# Patient Record
Sex: Female | Born: 2000 | Hispanic: Yes | Smoking: Never smoker
Health system: Southern US, Community
[De-identification: ages and names within clinical notes are randomized; demographics above are authoritative.]

## PROBLEM LIST (undated history)

## (undated) DIAGNOSIS — Z789 Other specified health status: Secondary | ICD-10-CM

## (undated) HISTORY — DX: Other specified health status: Z78.9

## (undated) HISTORY — PX: OVARIAN CYST REMOVAL: SHX89

---
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
## 2018-04-24 ENCOUNTER — Encounter: Payer: Self-pay | Admitting: Student

## 2018-04-24 ENCOUNTER — Ambulatory Visit (INDEPENDENT_AMBULATORY_CARE_PROVIDER_SITE_OTHER): Payer: Medicaid Other

## 2018-04-24 DIAGNOSIS — Z3201 Encounter for pregnancy test, result positive: Secondary | ICD-10-CM | POA: Diagnosis not present

## 2018-04-24 LAB — POCT PREGNANCY, URINE: Preg Test, Ur: POSITIVE — AB

## 2018-04-24 NOTE — Progress Notes (Signed)
Pt here today for pregancy test.  Resulted +.  Pt reports LMP 03/24/18.  EDD 12/29/2018  4w 3d.   Pt denies any bleeding or pain.  Medications reconciled.  List of medications safe to take in pregnancy given to pt.  Front office to provide a proof of pregnancy letter to start prenatal care.

## 2018-04-26 DIAGNOSIS — Z23 Encounter for immunization: Secondary | ICD-10-CM | POA: Diagnosis not present

## 2018-05-03 NOTE — Progress Notes (Signed)
I have reviewed the chart and agree with nursing staff's documentation of this patient's encounter.  [place] Bing, MD 05/03/2018 11:49 AM

## 2018-06-03 ENCOUNTER — Ambulatory Visit: Payer: Medicaid Other | Admitting: *Deleted

## 2018-06-03 ENCOUNTER — Other Ambulatory Visit (HOSPITAL_COMMUNITY)
Admission: RE | Admit: 2018-06-03 | Discharge: 2018-06-03 | Disposition: A | Discharge status: Home / Self Care | Payer: Medicaid Other | Source: Ambulatory Visit | Attending: Obstetrics and Gynecology | Admitting: Obstetrics and Gynecology

## 2018-06-03 ENCOUNTER — Other Ambulatory Visit: Payer: Self-pay

## 2018-06-03 DIAGNOSIS — Z3A10 10 weeks gestation of pregnancy: Secondary | ICD-10-CM | POA: Diagnosis not present

## 2018-06-03 DIAGNOSIS — Z3401 Encounter for supervision of normal first pregnancy, first trimester: Secondary | ICD-10-CM | POA: Diagnosis not present

## 2018-06-03 DIAGNOSIS — Z3400 Encounter for supervision of normal first pregnancy, unspecified trimester: Secondary | ICD-10-CM | POA: Insufficient documentation

## 2018-06-03 LAB — POCT URINALYSIS DIPSTICK OB
BILIRUBIN UA: NEGATIVE
Blood, UA: NEGATIVE
GLUCOSE, UA: NEGATIVE
Ketones, UA: NEGATIVE
Nitrite, UA: NEGATIVE
SPEC GRAV UA: 1.025 (ref 1.010–1.025)
Urobilinogen, UA: 0.2 E.U./dL
pH, UA: 6.5 (ref 5.0–8.0)

## 2018-06-03 NOTE — Patient Instructions (Addendum)
You will have an early ultrasound to confirm dates. Please keep upcoming appointment with provider.  Prenatal Care WHAT IS PRENATAL CARE? Prenatal care is the process of caring for a pregnant woman before she gives birth. Prenatal care makes sure that she and her baby remain as healthy as possible throughout pregnancy. Prenatal care may be provided by a midwife, family practice health care provider, or a childbirth and pregnancy specialist (obstetrician). Prenatal care may include physical examinations, testing, treatments, and education on nutrition, lifestyle, and social support services. WHY IS PRENATAL CARE SO IMPORTANT? Early and consistent prenatal care increases the chance that you and your baby will remain healthy throughout your pregnancy. This type of care also decreases a baby's risk of being born too early (prematurely), or being born smaller than expected (small for gestational age). Any underlying medical conditions you may have that could pose a risk during your pregnancy are discussed during prenatal care visits. You will also be monitored regularly for any new conditions that may arise during your pregnancy so they can be treated quickly and effectively. WHAT HAPPENS DURING PRENATAL CARE VISITS? Prenatal care visits may include the following: Discussion Tell your health care provider about any new signs or symptoms you have experienced since your last visit. These might include:  Nausea or vomiting.  Increased or decreased level of energy.  Difficulty sleeping.  Back or leg pain.  Weight changes.  Frequent urination.  Shortness of breath with physical activity.  Changes in your skin, such as the development of a rash or itchiness.  Vaginal discharge or bleeding.  Feelings of excitement or nervousness.  Changes in your baby's movements.  You may want to write down any questions or topics you want to discuss with your health care provider and bring them with you to  your appointment. Examination During your first prenatal care visit, you will likely have a complete physical exam. Your health care provider will often examine your vagina, cervix, and the position of your uterus, as well as check your heart, lungs, and other body systems. As your pregnancy progresses, your health care provider will measure the size of your uterus and your baby's position inside your uterus. He or she may also examine you for early signs of labor. Your prenatal visits may also include checking your blood pressure and, after about 10-12 weeks of pregnancy, listening to your baby's heartbeat. Testing Regular testing often includes:  Urinalysis. This checks your urine for glucose, protein, or signs of infection.  Blood count. This checks the levels of white and red blood cells in your body.  Tests for sexually transmitted infections (STIs). Testing for STIs at the beginning of pregnancy is routinely done and is required in many states.  Antibody testing. You will be checked to see if you are immune to certain illnesses, such as rubella, that can affect a developing fetus.  Glucose screen. Around 24-28 weeks of pregnancy, your blood glucose level will be checked for signs of gestational diabetes. Follow-up tests may be recommended.  Group B strep. This is a bacteria that is commonly found inside a woman's vagina. This test will inform your health care provider if you need an antibiotic to reduce the amount of this bacteria in your body prior to labor and childbirth.  Ultrasound. Many pregnant women undergo an ultrasound screening around 18-20 weeks of pregnancy to evaluate the health of the fetus and check for any developmental abnormalities.  HIV (human immunodeficiency virus) testing. Early in your pregnancy,  you will be screened for HIV. If you are at high risk for HIV, this test may be repeated during your third trimester of pregnancy.  You may be offered other testing based  on your age, personal or family medical history, or other factors. HOW OFTEN SHOULD I PLAN TO SEE MY HEALTH CARE PROVIDER FOR PRENATAL CARE? Your prenatal care check-up schedule depends on any medical conditions you have before, or develop during, your pregnancy. If you do not have any underlying medical conditions, you will likely be seen for checkups:  Monthly, during the first 6 months of pregnancy.  Twice a month during months 7 and 8 of pregnancy.  Weekly starting in the 9th month of pregnancy and until delivery.  If you develop signs of early labor or other concerning signs or symptoms, you may need to see your health care provider more often. Ask your health care provider what prenatal care schedule is best for you. WHAT CAN I DO TO KEEP MYSELF AND MY BABY AS HEALTHY AS POSSIBLE DURING MY PREGNANCY?  Take a prenatal vitamin containing 400 micrograms (0.4 mg) of folic acid every day. Your health care provider may also ask you to take additional vitamins such as iodine, vitamin D, iron, copper, and zinc.  Take 1500-2000 mg of calcium daily starting at your 20th week of pregnancy until you deliver your baby.  Make sure you are up to date on your vaccinations. Unless directed otherwise by your health care provider: ? You should receive a tetanus, diphtheria, and pertussis (Tdap) vaccination between the 27th and 36th week of your pregnancy, regardless of when your last Tdap immunization occurred. This helps protect your baby from whooping cough (pertussis) after he or she is born. ? You should receive an annual inactivated influenza vaccine (IIV) to help protect you and your baby from influenza. This can be done at any point during your pregnancy.  Eat a well-rounded diet that includes: ? Fresh fruits and vegetables. ? Lean proteins. ? Calcium-rich foods such as milk, yogurt, hard cheeses, and dark, leafy greens. ? Whole grain breads.  Do noteat seafood high in mercury,  including: ? Swordfish. ? Tilefish. ? Shark. ? King mackerel. ? More than 6 oz tuna per week.  Do not eat: ? Raw or undercooked meats or eggs. ? Unpasteurized foods, such as soft cheeses (brie, blue, or feta), juices, and milks. ? Lunch meats. ? Hot dogs that have not been heated until they are steaming.  Drink enough water to keep your urine clear or pale yellow. For many women, this may be 10 or more 8 oz glasses of water each day. Keeping yourself hydrated helps deliver nutrients to your baby and may prevent the start of pre-term uterine contractions.  Do not use any tobacco products including cigarettes, chewing tobacco, or electronic cigarettes. If you need help quitting, ask your health care provider.  Do not drink beverages containing alcohol. No safe level of alcohol consumption during pregnancy has been determined.  Do not use any illegal drugs. These can harm your developing baby or cause a miscarriage.  Ask your health care provider or pharmacist before taking any prescription or over-the-counter medicines, herbs, or supplements.  Limit your caffeine intake to no more than 200 mg per day.  Exercise. Unless told otherwise by your health care provider, try to get 30 minutes of moderate exercise most days of the week. Do not  do high-impact activities, contact sports, or activities with a high risk of falling, such  as horseback riding or downhill skiing.  Get plenty of rest.  Avoid anything that raises your body temperature, such as hot tubs and saunas.  If you own a cat, do not empty its litter box. Bacteria contained in cat feces can cause an infection called toxoplasmosis. This can result in serious harm to the fetus.  Stay away from chemicals such as insecticides, lead, mercury, and cleaning or paint products that contain solvents.  Do not have any X-rays taken unless medically necessary.  Take a childbirth and breastfeeding preparation class. Ask your health care  provider if you need a referral or recommendation.  This information is not intended to replace advice given to you by your health care provider. Make sure you discuss any questions you have with your health care provider. Document Released: 07/27/2003 Document Revised: 12/27/2015 Document Reviewed: 10/08/2013 Elsevier Interactive Patient Education  2017 [employer].  Preventing Preterm Birth Preterm birth is when your baby is delivered between 20 weeks and 37 weeks of pregnancy. A full-term pregnancy lasts for at least 37 weeks. Preterm birth can be dangerous for your baby because the last few weeks of pregnancy are an important time for your baby's brain and lungs to grow. Many things can cause a baby to be born early. Sometimes the cause is not known. There are certain factors that make you more likely to experience preterm birth, such as:  Having a previous baby born preterm.  Being pregnant with twins or other multiples.  Having had fertility treatment.  Being overweight or underweight at the start of your pregnancy.  Having any of the following during pregnancy: ? An infection, including a urinary tract infection (UTI) or an STI (sexually transmitted infection). ? High blood pressure. ? Diabetes. ? Vaginal bleeding.  Being age 80 or older.  Being age 76 or younger.  Getting pregnant within 6 months of a previous pregnancy.  Suffering extreme stress or physical or emotional abuse during pregnancy.  Standing for long periods of time during pregnancy, such as working at a job that requires standing.  What are the risks? The most serious risk of preterm birth is that the baby may not survive. This is more likely to happen if a baby is born before 34 weeks. Other risks and complications of preterm birth may include your baby having:  Breathing problems.  Brain damage that affects movement and coordination (cerebral palsy).  Feeding difficulties.  Vision or hearing  problems.  Infections or inflammation of the digestive tract (colitis).  Developmental delays.  Learning disabilities.  Higher risk for diabetes, heart disease, and high blood pressure later in life.  What can I do to lower my risk? Medical care  The most important thing you can do to lower your risk for preterm birth is to get routine medical care during pregnancy (prenatal care). If you have a high risk of preterm birth, you may be referred to a health care provider who specializes in managing high-risk pregnancies (perinatologist). You may be given medicine to help prevent preterm birth. Lifestyle changes Certain lifestyle changes can also lower your risk of preterm birth:  Wait at least 6 months after a pregnancy to become pregnant again.  Try to plan pregnancy for when you are between 14 and 74 years old.  Get to a healthy weight before getting pregnant. If you are overweight, work with your health care provider to safely lose weight.  Do not use any products that contain nicotine or tobacco, such as cigarettes and  e-cigarettes. If you need help quitting, ask your health care provider.  Do not drink alcohol.  Do not use drugs.  Where to find support: For more support, consider:  Talking with your health care provider.  Talking with a therapist or substance abuse counselor, if you need help quitting.  Working with a diet and nutrition specialist (dietitian) or a Systems analyst to maintain a healthy weight.  Joining a support group.  Where to find more information: Learn more about preventing preterm birth from:  Centers for Disease Control and Prevention: http://curry.org/  March of Dimes: marchofdimes.org/complications/premature-babies.aspx  American Pregnancy Association: americanpregnancy.org/labor-and-birth/premature-labor  Contact a health care provider if:  You have any of the following signs of preterm  labor before 37 weeks: ? A change or increase in vaginal discharge. ? Fluid leaking from your vagina. ? Pressure or cramps in your lower abdomen. ? A backache that does not go away or gets worse. ? Regular tightening (contractions) in your lower abdomen. Summary  Preterm birth means having your baby during weeks 20-37 of pregnancy.  Preterm birth may put your baby at risk for physical and mental problems.  Getting good prenatal care can help prevent preterm birth.  You can lower your risk of preterm birth by making certain lifestyle changes, such as not smoking and not using alcohol. This information is not intended to replace advice given to you by your health care provider. Make sure you discuss any questions you have with your health care provider. Document Released: 09/07/2015 Document Revised: 04/01/2016 Document Reviewed: 04/01/2016 Elsevier Interactive Patient Education  [employer].

## 2018-06-03 NOTE — Progress Notes (Signed)
   PRENATAL INTAKE SUMMARY  Ms. Froberg presents today New OB Nurse Interview.  OB History    Gravida  1   Para      Term      Preterm      AB      Living        SAB      TAB      Ectopic      Multiple      Live Births             I have reviewed the patient's medical, obstetrical, social, and family histories, medications, and available lab results.  SUBJECTIVE She has complains of thin clear vaginal discharge.   OBJECTIVE Initial nurse interview for history and lab work (New OB). EDD: 12/29/2018 GA:[redacted]w[redacted]d by LMP 03/24/18 G1P0  GENERAL APPEARANCE: alert, well appearing.   ASSESSMENT Normal pregnancy.  PLAN Prenatal care-[facility] Pnl/HIV  OB Urine Culture/dip GC/CT HgbEval SMA/CF/Panorama at next visit with CNM. A1C/Glucose-not indicated. U/S <14 wk w/transvaginal ordered to confirm date. Continue PNV. Advised on preterm labor prevention/symptoms. Pt to sign up for MyChart.  Clovis Pu, RN

## 2018-06-04 ENCOUNTER — Other Ambulatory Visit: Payer: Self-pay | Admitting: Obstetrics and Gynecology

## 2018-06-04 DIAGNOSIS — Z3687 Encounter for antenatal screening for uncertain dates: Secondary | ICD-10-CM

## 2018-06-04 LAB — OBSTETRIC PANEL, INCLUDING HIV
ANTIBODY SCREEN: NEGATIVE
BASOS: 1 %
Basophils Absolute: 0.1 10*3/uL (ref 0.0–0.2)
EOS (ABSOLUTE): 0.4 10*3/uL (ref 0.0–0.4)
EOS: 4 %
HEMOGLOBIN: 11.5 g/dL (ref 11.1–15.9)
HEP B S AG: NEGATIVE
HIV SCREEN 4TH GENERATION: NONREACTIVE
Hematocrit: 34.7 % (ref 34.0–46.6)
Immature Grans (Abs): 0 10*3/uL (ref 0.0–0.1)
Immature Granulocytes: 0 %
Lymphocytes Absolute: 2.1 10*3/uL (ref 0.7–3.1)
Lymphs: 23 %
MCH: 24.8 pg — AB (ref 26.6–33.0)
MCHC: 33.1 g/dL (ref 31.5–35.7)
MCV: 75 fL — AB (ref 79–97)
MONOS ABS: 0.6 10*3/uL (ref 0.1–0.9)
Monocytes: 6 %
NEUTROS ABS: 6 10*3/uL (ref 1.4–7.0)
NEUTROS PCT: 66 %
Platelets: 252 10*3/uL (ref 150–450)
RBC: 4.64 x10E6/uL (ref 3.77–5.28)
RDW: 14.2 % (ref 12.3–15.4)
RH TYPE: POSITIVE
RPR: NONREACTIVE
Rubella Antibodies, IGG: 1.52 index (ref >0.99)
WBC: 9.2 10*3/uL (ref 3.4–10.8)

## 2018-06-04 LAB — CERVICOVAGINAL ANCILLARY ONLY
Bacterial vaginitis: NEGATIVE
CANDIDA VAGINITIS: NEGATIVE
Chlamydia: NEGATIVE
Neisseria Gonorrhea: NEGATIVE
TRICH (WINDOWPATH): NEGATIVE

## 2018-06-04 LAB — SICKLE CELL SCREEN: Sickle Cell Screen: NEGATIVE

## 2018-06-04 NOTE — Progress Notes (Signed)
Corrected U/S ordered per U/S dept

## 2018-06-04 NOTE — Addendum Note (Signed)
Addended by: Kenard Gower on: 06/04/2018 04:44 PM   Modules accepted: Orders

## 2018-06-05 ENCOUNTER — Telehealth: Payer: Self-pay | Admitting: General Practice

## 2018-06-06 ENCOUNTER — Ambulatory Visit (HOSPITAL_COMMUNITY): Payer: Medicaid Other

## 2018-06-06 ENCOUNTER — Encounter (HOSPITAL_COMMUNITY): Payer: Self-pay

## 2018-06-07 LAB — CULTURE, OB URINE

## 2018-06-07 LAB — URINE CULTURE, OB REFLEX

## 2018-06-12 ENCOUNTER — Ambulatory Visit (INDEPENDENT_AMBULATORY_CARE_PROVIDER_SITE_OTHER): Payer: Self-pay | Admitting: General Practice

## 2018-06-12 ENCOUNTER — Other Ambulatory Visit: Payer: Self-pay | Admitting: Obstetrics and Gynecology

## 2018-06-12 ENCOUNTER — Ambulatory Visit (HOSPITAL_COMMUNITY)
Admission: RE | Admit: 2018-06-12 | Discharge: 2018-06-12 | Disposition: A | Discharge status: Home / Self Care | Payer: Medicaid Other | Source: Ambulatory Visit | Attending: Obstetrics and Gynecology | Admitting: Obstetrics and Gynecology

## 2018-06-12 DIAGNOSIS — Z3687 Encounter for antenatal screening for uncertain dates: Secondary | ICD-10-CM | POA: Diagnosis not present

## 2018-06-12 DIAGNOSIS — Z712 Person consulting for explanation of examination or test findings: Secondary | ICD-10-CM

## 2018-06-12 DIAGNOSIS — Z3A12 12 weeks gestation of pregnancy: Secondary | ICD-10-CM | POA: Insufficient documentation

## 2018-06-12 DIAGNOSIS — O26841 Uterine size-date discrepancy, first trimester: Secondary | ICD-10-CM | POA: Diagnosis not present

## 2018-06-12 NOTE — Progress Notes (Signed)
Agree with A & P. 

## 2018-06-12 NOTE — Progress Notes (Signed)
Patient presents to office today for dating ultrasound results. Reviewed with Dr Alysia Penna who finds [redacted] week gestation EDD 12/25/18, patient can begin prenatal care.  Informed patient of results. Patient has new OB appt 11/8 at [facility] office. Patient had no questions.

## 2018-06-14 ENCOUNTER — Ambulatory Visit (INDEPENDENT_AMBULATORY_CARE_PROVIDER_SITE_OTHER): Payer: Self-pay | Admitting: Obstetrics and Gynecology

## 2018-06-14 ENCOUNTER — Other Ambulatory Visit: Payer: Self-pay

## 2018-06-14 DIAGNOSIS — Z3400 Encounter for supervision of normal first pregnancy, unspecified trimester: Secondary | ICD-10-CM

## 2018-06-14 DIAGNOSIS — Z3401 Encounter for supervision of normal first pregnancy, first trimester: Secondary | ICD-10-CM

## 2018-06-14 NOTE — Progress Notes (Signed)
  Subjective:    Jennifer Griffith is being seen today for her first obstetrical visit.  This is a planned pregnancy. She is at [redacted]w[redacted]d gestation. Her obstetrical history is significant for N/A. Relationship with FOB (Homero): significant other, living together. Patient does intend to breast feed. Pregnancy history fully reviewed.  Patient reports no complaints.  Review of Systems:   Review of Systems  Constitutional: Negative.   HENT: Negative.   Eyes: Negative.   Respiratory: Negative.   Cardiovascular: Negative.   Gastrointestinal: Negative.   Endocrine: Negative.   Genitourinary: Negative.   Musculoskeletal: Negative.   Skin: Negative.   Allergic/Immunologic: Negative.   Neurological: Negative.   Hematological: Negative.   Psychiatric/Behavioral: Negative.     Objective:     BP 125/67   Pulse 74   Wt 183 lb 3.2 oz (83.1 kg)   LMP 03/24/2018 (LMP Unknown)   BMI 32.45 kg/m  Physical Exam  Nursing note and vitals reviewed. Constitutional: She is oriented to person, place, and time. She appears well-developed and well-nourished.  HENT:  Head: Normocephalic and atraumatic.  Right Ear: External ear normal.  Left Ear: External ear normal.  Nose: Nose normal.  Mouth/Throat: Oropharynx is clear and moist.  Eyes: Pupils are equal, round, and reactive to light. Conjunctivae and EOM are normal.  Neck: Normal range of motion. Neck supple.  Cardiovascular: Normal rate, regular rhythm, normal heart sounds and intact distal pulses.  Respiratory: Effort normal and breath sounds normal.  GI: Soft. Bowel sounds are normal.  Genitourinary: Vagina normal and uterus normal.  Genitourinary Comments: Uterus: enlarged, S=D, SE: cervix is smooth, pink, no lesions, small amt of thick, white vaginal d/c; Cervix: closed/long/firm, no CMT or friability, no adnexal tenderness   Musculoskeletal: Normal range of motion.  Neurological: She is alert and oriented to person, place, and time. She has  normal reflexes.  Skin: Skin is warm and dry.  Psychiatric: She has a normal mood and affect. Her behavior is normal. Judgment and thought content normal.    Maternal Exam:  Abdomen: Patient reports no abdominal tenderness. Introitus: Normal vulva. Normal vagina.  Ferning test: not done.  Nitrazine test: not done. Amniotic fluid character: not assessed.  Pelvis: adequate for delivery.   Cervix: Cervix evaluated by sterile speculum exam and digital exam.     Fetal Exam Fetal Monitor Review: Mode: ultrasound.   Baseline rate: 150 bpm.      Patient informed that the ultrasound is considered a limited OB ultrasound and is not intended to be a complete ultrasound exam.  Patient also informed that the ultrasound is not being completed with the intent of assessing for fetal or placental anomalies or any pelvic abnormalities.  Explained that the purpose of today's ultrasound is to assess for  viability d/t difficulty hearing FHTs with hand-held doppler.  Patient acknowledges the purpose of the exam and the limitations of the study.  Assessment:    Pregnancy: G1P0 Patient Active Problem List   Diagnosis Date Noted  . Supervision of normal first pregnancy, antepartum 06/03/2018       Plan:     Initial labs drawn. Prenatal vitamins. Problem list reviewed and updated. AFP3 discussed: undecided. Role of ultrasound in pregnancy discussed; fetal survey: undecided. Amniocentesis discussed: not indicated. Follow up in 4 weeks. 50% of 40 min visit spent on counseling and coordination of care.     Raelyn Mora, MSN, CNM 06/14/2018

## 2018-06-14 NOTE — Patient Instructions (Signed)
First Trimester of Pregnancy The first trimester of pregnancy is from week 1 until the end of week 13 (months 1 through 3). During this time, your baby will begin to develop inside you. At 6-8 weeks, the eyes and face are formed, and the heartbeat can be seen on ultrasound. At the end of 12 weeks, all the baby's organs are formed. Prenatal care is all the medical care you receive before the birth of your baby. Make sure you get good prenatal care and follow all of your doctor's instructions. Follow these instructions at home: Medicines  Take over-the-counter and prescription medicines only as told by your doctor. Some medicines are safe and some medicines are not safe during pregnancy.  Take a prenatal vitamin that contains at least 600 micrograms (mcg) of folic acid.  If you have trouble pooping (constipation), take medicine that will make your stool soft (stool softener) if your doctor approves. Eating and drinking  Eat regular, healthy meals.  Your doctor will tell you the amount of weight gain that is right for you.  Avoid raw meat and uncooked cheese.  If you feel sick to your stomach (nauseous) or throw up (vomit): ? Eat 4 or 5 small meals a day instead of 3 large meals. ? Try eating a few soda crackers. ? Drink liquids between meals instead of during meals.  To prevent constipation: ? Eat foods that are high in fiber, like fresh fruits and vegetables, whole grains, and beans. ? Drink enough fluids to keep your pee (urine) clear or pale yellow. Activity  Exercise only as told by your doctor. Stop exercising if you have cramps or pain in your lower belly (abdomen) or low back.  Do not exercise if it is too hot, too humid, or if you are in a place of great height (high altitude).  Try to avoid standing for long periods of time. Move your legs often if you must stand in one place for a long time.  Avoid heavy lifting.  Wear low-heeled shoes. Sit and stand up straight.  You  can have sex unless your doctor tells you not to. Relieving pain and discomfort  Wear a good support bra if your breasts are sore.  Take warm water baths (sitz baths) to soothe pain or discomfort caused by hemorrhoids. Use hemorrhoid cream if your doctor says it is okay.  Rest with your legs raised if you have leg cramps or low back pain.  If you have puffy, bulging veins (varicose veins) in your legs: ? Wear support hose or compression stockings as told by your doctor. ? Raise (elevate) your feet for 15 minutes, 3-4 times a day. ? Limit salt in your food. Prenatal care  Schedule your prenatal visits by the twelfth week of pregnancy.  Write down your questions. Take them to your prenatal visits.  Keep all your prenatal visits as told by your doctor. This is important. Safety  Wear your seat belt at all times when driving.  Make a list of emergency phone numbers. The list should include numbers for family, friends, the hospital, and police and fire departments. General instructions  Ask your doctor for a referral to a local prenatal class. Begin classes no later than at the start of month 6 of your pregnancy.  Ask for help if you need counseling or if you need help with nutrition. Your doctor can give you advice or tell you where to go for help.  Do not use hot tubs, steam rooms, or   saunas.  Do not douche or use tampons or scented sanitary pads.  Do not cross your legs for long periods of time.  Avoid all herbs and alcohol. Avoid drugs that are not approved by your doctor.  Do not use any tobacco products, including cigarettes, chewing tobacco, and electronic cigarettes. If you need help quitting, ask your doctor. You may get counseling or other support to help you quit.  Avoid cat litter boxes and soil used by cats. These carry germs that can cause birth defects in the baby and can cause a loss of your baby (miscarriage) or stillbirth.  Visit your dentist. At home, brush  your teeth with a soft toothbrush. Be gentle when you floss. Contact a doctor if:  You are dizzy.  You have mild cramps or pressure in your lower belly.  You have a nagging pain in your belly area.  You continue to feel sick to your stomach, you throw up, or you have watery poop (diarrhea).  You have a bad smelling fluid coming from your vagina.  You have pain when you pee (urinate).  You have increased puffiness (swelling) in your face, hands, legs, or ankles. Get help right away if:  You have a fever.  You are leaking fluid from your vagina.  You have spotting or bleeding from your vagina.  You have very bad belly cramping or pain.  You gain or lose weight rapidly.  You throw up blood. It may look like coffee grounds.  You are around people who have German measles, fifth disease, or chickenpox.  You have a very bad headache.  You have shortness of breath.  You have any kind of trauma, such as from a fall or a car accident. Summary  The first trimester of pregnancy is from week 1 until the end of week 13 (months 1 through 3).  To take care of yourself and your unborn baby, you will need to eat healthy meals, take medicines only if your doctor tells you to do so, and do activities that are safe for you and your baby.  Keep all follow-up visits as told by your doctor. This is important as your doctor will have to ensure that your baby is healthy and growing well. This information is not intended to replace advice given to you by your health care provider. Make sure you discuss any questions you have with your health care provider. Document Released: 01/10/2008 Document Revised: 08/01/2016 Document Reviewed: 08/01/2016 Elsevier Interactive Patient Education  2017 [employer]  

## 2018-06-20 ENCOUNTER — Encounter: Payer: Medicaid Other | Admitting: Obstetrics and Gynecology

## 2018-07-10 ENCOUNTER — Ambulatory Visit (INDEPENDENT_AMBULATORY_CARE_PROVIDER_SITE_OTHER): Payer: Self-pay | Admitting: Obstetrics and Gynecology

## 2018-07-10 ENCOUNTER — Other Ambulatory Visit: Payer: Self-pay

## 2018-07-10 VITALS — BP 113/68 | HR 72 | Wt 184.6 lb

## 2018-07-10 DIAGNOSIS — Z3400 Encounter for supervision of normal first pregnancy, unspecified trimester: Secondary | ICD-10-CM

## 2018-07-10 DIAGNOSIS — Z3402 Encounter for supervision of normal first pregnancy, second trimester: Secondary | ICD-10-CM

## 2018-07-10 DIAGNOSIS — Z3A15 15 weeks gestation of pregnancy: Secondary | ICD-10-CM

## 2018-07-10 NOTE — Progress Notes (Signed)
   PRENATAL VISIT NOTE  Subjective:  Jennifer Griffith is a 17 y.o. G1P0 at 1150w3d being seen today for ongoing prenatal care.  She is currently monitored for the following issues for this low-risk pregnancy and has Supervision of normal first pregnancy, antepartum on their problem list.  Patient reports no complaints.  Contractions: Not present. Vag. Bleeding: None.  Movement: Absent. Denies leaking of fluid.   The following portions of the patient's history were reviewed and updated as appropriate: allergies, current medications, past family history, past medical history, past social history, past surgical history and problem list. Problem list updated.  Objective:   Vitals:   07/10/18 0818  BP: 113/68  Pulse: 72  Weight: 83.7 kg    Fetal Status: Fetal Heart Rate (bpm): 144   Movement: Absent     General:  Alert, oriented and cooperative. Patient is in no acute distress.  Skin: Skin is warm and dry. No rash noted.   Cardiovascular: Normal heart rate noted  Respiratory: Normal respiratory effort, no problems with respiration noted  Abdomen: Soft, gravid, appropriate for gestational age.  Pain/Pressure: Absent     Pelvic: Cervical exam deferred        Extremities: Normal range of motion.  Edema: None  Mental Status: Normal mood and affect. Normal behavior. Normal judgment and thought content.   Assessment and Plan:  Pregnancy: G1P0 at 7150w3d  1. Supervision of normal first pregnancy, antepartum - Size=Dates - Will bring flu shot documentation at next appointment. - Requested pp IUD. - US MFM OB COMP + 14 WK; Future - Cystic Fibrosis Mutation 97 - SMN1 COPY NUMBER ANALYSIS (SMA Carrier Screen)  Preterm labor symptoms and general obstetric precautions including but not limited to vaginal bleeding, contractions, leaking of fluid and fetal movement were reviewed in detail with the patient. Please refer to After Visit Summary for other counseling recommendations.  Return in about 5  weeks (around 08/14/2018) for Return OB visit.  Future Appointments  Date Time Provider Department Center  08/08/2018 10:10 AM Raelyn Moraawson, [place], CNM [facility] None    Bernerd LimboJamilla R Tamberlyn Midgley, Student-MidWife

## 2018-07-15 ENCOUNTER — Encounter: Payer: Self-pay | Admitting: General Practice

## 2018-07-17 LAB — CYSTIC FIBROSIS MUTATION 97: GENE DIS ANAL CARRIER INTERP BLD/T-IMP: NOT DETECTED

## 2018-07-18 LAB — SMN1 COPY NUMBER ANALYSIS (SMA CARRIER SCREENING)

## 2018-07-29 ENCOUNTER — Encounter (HOSPITAL_COMMUNITY): Payer: Self-pay

## 2018-08-06 ENCOUNTER — Other Ambulatory Visit (HOSPITAL_COMMUNITY): Payer: Self-pay | Admitting: *Deleted

## 2018-08-06 ENCOUNTER — Ambulatory Visit (HOSPITAL_COMMUNITY)
Admission: RE | Admit: 2018-08-06 | Discharge: 2018-08-06 | Disposition: A | Discharge status: Home / Self Care | Payer: Medicaid Other | Source: Ambulatory Visit | Attending: Obstetrics and Gynecology | Admitting: Obstetrics and Gynecology

## 2018-08-06 ENCOUNTER — Other Ambulatory Visit: Payer: Self-pay | Admitting: Obstetrics and Gynecology

## 2018-08-06 DIAGNOSIS — O99212 Obesity complicating pregnancy, second trimester: Secondary | ICD-10-CM

## 2018-08-06 DIAGNOSIS — Z362 Encounter for other antenatal screening follow-up: Secondary | ICD-10-CM

## 2018-08-06 DIAGNOSIS — Z363 Encounter for antenatal screening for malformations: Secondary | ICD-10-CM

## 2018-08-06 DIAGNOSIS — Z3400 Encounter for supervision of normal first pregnancy, unspecified trimester: Secondary | ICD-10-CM | POA: Insufficient documentation

## 2018-08-06 DIAGNOSIS — Z3A19 19 weeks gestation of pregnancy: Secondary | ICD-10-CM

## 2018-08-07 NOTE — L&D Delivery Note (Addendum)
Delivery Note Jennifer Griffith is a 18 y.o. G1P0 at [redacted]w[redacted]d admitted for PROM.  Labor course: cytotec, foley bulb, pitocin ROM: 28h 84m with clear fluid  At 0832 a viable female was delivered via spontaneous vaginal delivery (Presentation: ROA). Shoulders were snug, McRobert's effectively released anterior shoulder.  Infant, very warm to touch, placed directly on mom's abdomen for bonding/skin-to-skin. Baby's temp 102. Delayed cord clamping x , then cord clamped x 2, and cut by FOB.  APGAR: see delivery note, vigorous infant; weight: pending at time of note.  40 units of pitocin diluted in 1000cc LR was infused rapidly IV per protocol. The placenta separated spontaneously and delivered via CCT and maternal pushing effort.  It was inspected and appears to be intact with a 3 VC.  Placenta/Cord with the following complications: slight foul smell, sent to path. Mom afebrile throughout labor, no fetal tachycardia noted, but was ruptured 28hr prior to delivery. Cord pH: not done  Intrapartum complications:  Suspected chorio Anesthesia:  epidural Episiotomy: none Lacerations:  2nd degree Suture Repair: 3.0 vicryl rapide Est. Blood Loss (mL): 250 Sponge and instrument count were correct x2.  Mom to postpartum.  Baby to Couplet care / Skin to Skin. Placenta to path. Plans to breastfeed Contraception: postplacental Liletta IUD placed Circ: n/a  Cheral Marker, CNM, [facility] 12/30/2018 9:21 AM   Post-Placental IUD Insertion Procedure Note  Patient identified, informed consent signed prior to delivery, signed copy in chart, time out was performed.    Vaginal, labial and perineal areas thoroughly inspected for lacerations. 2nd degree laceration identified - repaired after insertion of IUD.   Liletta  - IUD grasped between sterile gloved fingers. Fundus identified through abdominal wall using non-insertion hand. IUD inserted to fundus with bimanual technique. IUD carefully released at the fundus  and insertion hand gently removed from vagina.   Strings trimmed to the level of the introitus. Patient tolerated procedure well.  Lot # [id number] Expiration Date Nov 2023  Patient given post procedure instructions and IUD care card with expiration date.  Patient is asked to keep IUD strings tucked in her vagina until her postpartum follow up visit in 4-6 weeks. Patient advised to abstain from sexual intercourse and pulling on strings before her follow-up visit. Patient verbalized an understanding of the plan of care and agrees.    Cheral Marker, CNM, [facility] 12/30/2018 9:25 AM

## 2018-08-08 ENCOUNTER — Encounter: Payer: Medicaid Other | Admitting: Obstetrics and Gynecology

## 2018-08-14 ENCOUNTER — Ambulatory Visit (INDEPENDENT_AMBULATORY_CARE_PROVIDER_SITE_OTHER): Payer: Medicaid Other | Admitting: Obstetrics and Gynecology

## 2018-08-14 ENCOUNTER — Encounter: Payer: Self-pay | Admitting: General Practice

## 2018-08-14 ENCOUNTER — Other Ambulatory Visit: Payer: Self-pay

## 2018-08-14 VITALS — BP 118/70 | HR 75 | Wt 186.6 lb

## 2018-08-14 DIAGNOSIS — Z3482 Encounter for supervision of other normal pregnancy, second trimester: Secondary | ICD-10-CM | POA: Diagnosis not present

## 2018-08-14 DIAGNOSIS — Z3402 Encounter for supervision of normal first pregnancy, second trimester: Secondary | ICD-10-CM | POA: Diagnosis not present

## 2018-08-14 DIAGNOSIS — N949 Unspecified condition associated with female genital organs and menstrual cycle: Secondary | ICD-10-CM

## 2018-08-14 DIAGNOSIS — Z3A20 20 weeks gestation of pregnancy: Secondary | ICD-10-CM

## 2018-08-14 NOTE — Progress Notes (Signed)
   PRENATAL VISIT NOTE  Subjective:  Jennifer Griffith is a 18 y.o. G1P0 at [redacted]w[redacted]d being seen today for ongoing prenatal care.  She is currently monitored for the following issues for this low-risk pregnancy and has Supervision of normal first pregnancy, antepartum on their problem list.  Patient reports round ligament pain.  Contractions: Not present. Vag. Bleeding: None.  Movement: Present. Denies leaking of fluid.   The following portions of the patient's history were reviewed and updated as appropriate: allergies, current medications, past family history, past medical history, past social history, past surgical history and problem list. Problem list updated.  Objective:   Vitals:   08/14/18 0907  BP: 118/70  Pulse: 75  Weight: 84.6 kg    Fetal Status: Fetal Heart Rate (bpm): 150 Fundal Height: 20 cm Movement: Present     General:  Alert, oriented and cooperative. Patient is in no acute distress.  Skin: Skin is warm and dry. No rash noted.   Cardiovascular: Normal heart rate noted  Respiratory: Normal respiratory effort, no problems with respiration noted  Abdomen: Soft, gravid, appropriate for gestational age.  Pain/Pressure: Present     Pelvic: Cervical exam deferred        Extremities: Normal range of motion.  Edema: None  Mental Status: Normal mood and affect. Normal behavior. Normal judgment and thought content.   Assessment and Plan:  Pregnancy: G1P0 at [redacted]w[redacted]d  1. Encounter for supervision of normal first pregnancy in second trimester - Panorama today  2. Round ligament pain - Discussed massage and stretches to relieve pain.  Preterm labor symptoms and general obstetric precautions including but not limited to vaginal bleeding, contractions, leaking of fluid and fetal movement were reviewed in detail with the patient. Please refer to After Visit Summary for other counseling recommendations.  Return in about 4 weeks (around 09/11/2018) for ROB.  Future Appointments    Date Time Provider Department Center  09/03/2018  8:15 AM [facility] [place] 4 [facility]    Bernerd Limbo, Student-MidWife

## 2018-08-15 ENCOUNTER — Encounter: Payer: Self-pay | Admitting: General Practice

## 2018-08-26 ENCOUNTER — Encounter: Payer: Self-pay | Admitting: General Practice

## 2018-09-03 ENCOUNTER — Ambulatory Visit (HOSPITAL_COMMUNITY)
Admission: RE | Admit: 2018-09-03 | Discharge: 2018-09-03 | Disposition: A | Discharge status: Home / Self Care | Payer: Medicaid Other | Source: Ambulatory Visit | Attending: Obstetrics and Gynecology | Admitting: Obstetrics and Gynecology

## 2018-09-03 DIAGNOSIS — Z362 Encounter for other antenatal screening follow-up: Secondary | ICD-10-CM

## 2018-09-03 DIAGNOSIS — Z3687 Encounter for antenatal screening for uncertain dates: Secondary | ICD-10-CM | POA: Diagnosis not present

## 2018-09-03 DIAGNOSIS — Z363 Encounter for antenatal screening for malformations: Secondary | ICD-10-CM | POA: Diagnosis not present

## 2018-09-03 DIAGNOSIS — O99212 Obesity complicating pregnancy, second trimester: Secondary | ICD-10-CM | POA: Diagnosis not present

## 2018-09-03 DIAGNOSIS — Z3A23 23 weeks gestation of pregnancy: Secondary | ICD-10-CM

## 2018-09-06 ENCOUNTER — Encounter: Payer: Self-pay | Admitting: General Practice

## 2018-09-11 ENCOUNTER — Ambulatory Visit (INDEPENDENT_AMBULATORY_CARE_PROVIDER_SITE_OTHER): Payer: Medicaid Other | Admitting: Obstetrics and Gynecology

## 2018-09-11 VITALS — BP 105/59 | HR 75 | Wt 196.2 lb

## 2018-09-11 DIAGNOSIS — Z3402 Encounter for supervision of normal first pregnancy, second trimester: Secondary | ICD-10-CM

## 2018-09-11 DIAGNOSIS — O2602 Excessive weight gain in pregnancy, second trimester: Secondary | ICD-10-CM

## 2018-09-11 NOTE — Progress Notes (Addendum)
   PRENATAL VISIT NOTE  Subjective:  Jennifer Griffith is a 18 y.o. G1P0 at [redacted]w[redacted]d being seen today for ongoing prenatal care.  She is currently monitored for the following issues for this low-risk pregnancy and has Supervision of normal first pregnancy, antepartum on their problem list.  Patient reports tailbone pain, related to sitting for long periods of time.  Contractions: Not present. Vag. Bleeding: None.  Movement: Present. Denies leaking of fluid. Has gained 10lbs since her last visit. Diet recall showed a diet rich in simple carbohydrates, low in nutrients.   The following portions of the patient's history were reviewed and updated as appropriate: allergies, current medications, past family history, past medical history, past social history, past surgical history and problem list. Problem list updated.  Objective:   Vitals:   09/11/18 0907  BP: (!) 105/59  Pulse: 75  Weight: 89 kg    Fetal Status: Fetal Heart Rate (bpm): 145 Fundal Height: 25 cm Movement: Present     General:  Alert, oriented and cooperative. Patient is in no acute distress.  Skin: Skin is warm and dry. No rash noted.   Cardiovascular: Normal heart rate noted  Respiratory: Normal respiratory effort, no problems with respiration noted  Abdomen: Soft, gravid, appropriate for gestational age.  Pain/Pressure: Present     Pelvic: Cervical exam deferred        Extremities: Normal range of motion.  Edema: None No swelling, redness, or tenderness on tailbone.  Mental Status: Normal mood and affect. Normal behavior. Normal judgment and thought content.   Assessment and Plan:  Pregnancy: G1P0 at [redacted]w[redacted]d  1. Encounter for supervision of normal first pregnancy in second trimester - Discussed stretches and ideas for preventing tailbone pain while she studies. - Gave anticipatory guidance about her next visit and 2hr GTT.  2. Excess weight gain in pregnancy, second trimester - Discussed importance of eating protein-rich,  smaller and more frequent meals to fuel her body better. Patient verbalized understanding and commitment to eating better.   Preterm labor symptoms and general obstetric precautions including but not limited to vaginal bleeding, contractions, leaking of fluid and fetal movement were reviewed in detail with the patient. Please refer to After Visit Summary for other counseling recommendations.  Return in about 4 years (around 09/11/2022) for ROB & GTT.  Future Appointments  Date Time Provider Department Center  10/09/2018  8:30 AM [facility] None  10/09/2018  9:10 AM Raelyn Mora, CNM [facility] None    Bernerd Limbo, Student-MidWife

## 2018-10-09 ENCOUNTER — Ambulatory Visit (INDEPENDENT_AMBULATORY_CARE_PROVIDER_SITE_OTHER): Payer: Medicaid Other | Admitting: Obstetrics and Gynecology

## 2018-10-09 ENCOUNTER — Encounter: Payer: Self-pay | Admitting: *Deleted

## 2018-10-09 ENCOUNTER — Other Ambulatory Visit: Payer: Medicaid Other | Admitting: *Deleted

## 2018-10-09 ENCOUNTER — Other Ambulatory Visit: Payer: Self-pay

## 2018-10-09 ENCOUNTER — Encounter: Payer: Self-pay | Admitting: General Practice

## 2018-10-09 VITALS — BP 123/75 | HR 81 | Ht 63.0 in | Wt 201.4 lb

## 2018-10-09 VITALS — BP 123/75 | HR 81 | Wt 201.4 lb

## 2018-10-09 DIAGNOSIS — Z3400 Encounter for supervision of normal first pregnancy, unspecified trimester: Secondary | ICD-10-CM

## 2018-10-09 DIAGNOSIS — Z23 Encounter for immunization: Secondary | ICD-10-CM | POA: Diagnosis not present

## 2018-10-09 DIAGNOSIS — Z3A28 28 weeks gestation of pregnancy: Secondary | ICD-10-CM

## 2018-10-09 DIAGNOSIS — Z3403 Encounter for supervision of normal first pregnancy, third trimester: Secondary | ICD-10-CM

## 2018-10-09 NOTE — Progress Notes (Signed)
   PRENATAL VISIT NOTE  Subjective:  Jennifer Griffith is a 18 y.o. G1P0 at [redacted]w[redacted]d being seen today for ongoing prenatal care.  She is currently monitored for the following issues for this low-risk pregnancy and has Supervision of normal first pregnancy, antepartum on their problem list.  Patient reports no complaints.  Contractions: Not present. Vag. Bleeding: None.  Movement: Present. Denies leaking of fluid. Started working out again, that has helped her lower back pain significantly. Had questions about switching care to be closer to her new address in [place].   The following portions of the patient's history were reviewed and updated as appropriate: allergies, current medications, past family history, past medical history, past social history, past surgical history and problem list. Problem list updated.  Objective:   Vitals:   10/09/18 0847  BP: 123/75  Pulse: 81  Weight: 91.4 kg    Fetal Status: Fetal Heart Rate (bpm): 152 Fundal Height: 28 cm Movement: Present     General:  Alert, oriented and cooperative. Patient is in no acute distress.  Skin: Skin is warm and dry. No rash noted.   Cardiovascular: Normal heart rate noted  Respiratory: Normal respiratory effort, no problems with respiration noted  Abdomen: Soft, gravid, appropriate for gestational age.  Pain/Pressure: Absent     Pelvic: Cervical exam deferred        Extremities: Normal range of motion.  Edema: None  Mental Status: Normal mood and affect. Normal behavior. Normal judgment and thought content.   Assessment and Plan:  Pregnancy: G1P0 at [redacted]w[redacted]d  1. Supervision of normal first pregnancy, antepartum - HIV Antibody (routine testing w rflx) - RPR - Culture, OB Urine - CBC - Tdap vaccine greater than or equal to 7yo IM - Discussed safe body mechanics while working out - Will switch prenatal care to [employer], with plans to give birth at [facility].  Preterm labor symptoms and general obstetric precautions  including but not limited to vaginal bleeding, contractions, leaking of fluid and fetal movement were reviewed in detail with the patient. Please refer to After Visit Summary for other counseling recommendations.  Return in about 2 weeks (around 10/23/2018) for ROB.  Bernerd Limbo, Student-MidWife

## 2018-10-09 NOTE — Progress Notes (Signed)
   Patient in clinic for 28 week lab work.  Coron Rossano L, RN  

## 2018-10-10 LAB — CBC
Hematocrit: 31.7 % — ABNORMAL LOW (ref 34.0–46.6)
Hemoglobin: 10.5 g/dL — ABNORMAL LOW (ref 11.1–15.9)
MCH: 24.8 pg — AB (ref 26.6–33.0)
MCHC: 33.1 g/dL (ref 31.5–35.7)
MCV: 75 fL — AB (ref 79–97)
Platelets: 234 10*3/uL (ref 150–450)
RBC: 4.23 x10E6/uL (ref 3.77–5.28)
RDW: 13.7 % (ref 11.7–15.4)
WBC: 11.8 10*3/uL — ABNORMAL HIGH (ref 3.4–10.8)

## 2018-10-10 LAB — GLUCOSE TOLERANCE, 2 HOURS W/ 1HR
Glucose, 1 hour: 115 mg/dL (ref 65–179)
Glucose, 2 hour: 93 mg/dL (ref 65–152)
Glucose, Fasting: 70 mg/dL (ref 65–91)

## 2018-10-10 LAB — HIV ANTIBODY (ROUTINE TESTING W REFLEX): HIV SCREEN 4TH GENERATION: NONREACTIVE

## 2018-10-10 LAB — RPR: RPR Ser Ql: NONREACTIVE

## 2018-10-11 LAB — URINE CULTURE, OB REFLEX

## 2018-10-11 LAB — CULTURE, OB URINE

## 2018-10-12 ENCOUNTER — Encounter: Payer: Self-pay | Admitting: Obstetrics and Gynecology

## 2018-10-25 ENCOUNTER — Encounter: Payer: Medicaid Other | Admitting: Certified Nurse Midwife

## 2018-10-29 ENCOUNTER — Telehealth: Payer: Self-pay

## 2018-10-29 ENCOUNTER — Encounter: Payer: Medicaid Other | Admitting: Advanced Practice Midwife

## 2018-10-29 DIAGNOSIS — Z3403 Encounter for supervision of normal first pregnancy, third trimester: Secondary | ICD-10-CM

## 2018-10-29 DIAGNOSIS — Z3480 Encounter for supervision of other normal pregnancy, unspecified trimester: Secondary | ICD-10-CM | POA: Insufficient documentation

## 2018-10-29 NOTE — Telephone Encounter (Signed)
Spoke with pt and she is aware that we are changing her appt on 3/25 to a telephone visit and pt will be signed up for Babyscripts per Dr.Dove. Order has been placed for babyscripts and pt is aware of the process to set it up and that she will do BP once a week on through the app. Pt expressed understanding.

## 2018-10-30 ENCOUNTER — Other Ambulatory Visit: Payer: Self-pay

## 2018-10-30 ENCOUNTER — Ambulatory Visit (INDEPENDENT_AMBULATORY_CARE_PROVIDER_SITE_OTHER): Payer: Medicaid Other | Admitting: Obstetrics and Gynecology

## 2018-10-30 DIAGNOSIS — Z3A31 31 weeks gestation of pregnancy: Secondary | ICD-10-CM

## 2018-10-30 DIAGNOSIS — Z3403 Encounter for supervision of normal first pregnancy, third trimester: Secondary | ICD-10-CM

## 2018-10-30 DIAGNOSIS — Z3480 Encounter for supervision of other normal pregnancy, unspecified trimester: Secondary | ICD-10-CM

## 2018-10-30 DIAGNOSIS — Z3400 Encounter for supervision of normal first pregnancy, unspecified trimester: Secondary | ICD-10-CM

## 2018-10-30 NOTE — Progress Notes (Signed)
Whote D/C but denies any itching or burning

## 2018-10-30 NOTE — Progress Notes (Signed)
   PRENATAL VISIT NOTE  Subjective:  Jennifer Griffith is a 18 y.o. G1P0 at [redacted]w[redacted]d being seen today for ongoing prenatal care.  She is currently monitored for the following issues for this low-risk pregnancy and has Supervision of normal first pregnancy, antepartum and Late Entry to Babyscripts- March 2020- Social Distancing on their problem list.   Visit done today via tele visit due to Covid 19  Patient reports no complaints.  Contractions: Not present. Vag. Bleeding: None.  Movement: Present. Denies leaking of fluid.   The following portions of the patient's history were reviewed and updated as appropriate: allergies, current medications, past family history, past medical history, past social history, past surgical history and problem list.   Objective:  There were no vitals filed for this visit.  Fetal Status:     Movement: Present     General:  Alert, oriented and cooperative. Patient is in no acute distress.  Skin: Skin is warm and dry. No rash noted.   Cardiovascular: Normal heart rate noted  Respiratory: Normal respiratory effort, no problems with respiration noted  Abdomen: Soft, gravid, appropriate for gestational age.  Pain/Pressure: Present     Pelvic: Cervical exam deferred        Extremities: Normal range of motion.  Edema: None  Mental Status: Normal mood and affect. Normal behavior. Normal judgment and thought content.   Assessment and Plan:  Pregnancy: G1P0 at [redacted]w[redacted]d   1. Supervision of normal first pregnancy, antepartum  Doing well,  Having some mild round ligament pain when she changes positions.  [place] picuture shoes female, Cindy Hazy shows female. Reviewed [place] images, no clear images of female. Will contact radiology and have reviewed   2. Late Entry to Babyscripts- March 2020- Social Distancing  Waiting on cuff; Opt schedule initiated  Feels good, no headache or changes in her visit.    There are no diagnoses linked to this encounter. Preterm labor symptoms and  general obstetric precautions including but not limited to vaginal bleeding, contractions, leaking of fluid and fetal movement were reviewed in detail with the patient. Please refer to After Visit Summary for other counseling recommendations.   Return for Telephone visit in 2 weeks.  No future appointments.  Venia Carbon, NP

## 2018-11-05 ENCOUNTER — Other Ambulatory Visit: Payer: Self-pay

## 2018-11-05 ENCOUNTER — Ambulatory Visit (INDEPENDENT_AMBULATORY_CARE_PROVIDER_SITE_OTHER): Payer: Medicaid Other | Admitting: *Deleted

## 2018-11-05 DIAGNOSIS — Z3400 Encounter for supervision of normal first pregnancy, unspecified trimester: Secondary | ICD-10-CM

## 2018-11-05 NOTE — Progress Notes (Signed)
Pt here for a redraw of her Panorama.  She had it drawn earlier @ [facility] and was told she had a girl but her U/S tech said female.  Venia Carbon, NP spoke with Avelina Laine and they are allowing a redraw without charge.

## 2018-11-13 ENCOUNTER — Other Ambulatory Visit: Payer: Self-pay

## 2018-11-13 ENCOUNTER — Ambulatory Visit (INDEPENDENT_AMBULATORY_CARE_PROVIDER_SITE_OTHER): Payer: Medicaid Other | Admitting: Obstetrics and Gynecology

## 2018-11-13 DIAGNOSIS — Z3A33 33 weeks gestation of pregnancy: Secondary | ICD-10-CM | POA: Diagnosis not present

## 2018-11-13 DIAGNOSIS — Z3483 Encounter for supervision of other normal pregnancy, third trimester: Secondary | ICD-10-CM | POA: Diagnosis not present

## 2018-11-13 DIAGNOSIS — Z3480 Encounter for supervision of other normal pregnancy, unspecified trimester: Secondary | ICD-10-CM

## 2018-11-13 DIAGNOSIS — Z3400 Encounter for supervision of normal first pregnancy, unspecified trimester: Secondary | ICD-10-CM

## 2018-11-13 NOTE — Progress Notes (Signed)
   TELEHEALTH VIRTUAL OBSTETRICS VISIT ENCOUNTER NOTE  I connected with Jennifer Griffith on 11/13/18 at 10:15 AM EDT by telephone at home and verified that I am speaking with the correct person using two identifiers.   I discussed the limitations, risks, security and privacy concerns of performing an evaluation and management service by telephone and the availability of in person appointments. I also discussed with the patient that there may be a patient responsible charge related to this service. The patient expressed understanding and agreed to proceed.  Subjective:  Jennifer Griffith is a 18 y.o. G1P0 at [redacted]w[redacted]d being followed for ongoing prenatal care.  She is currently monitored for the following issues for this low-risk pregnancy and has Supervision of normal first pregnancy, antepartum and Late Entry to Babyscripts- March 2020- Social Distancing on their problem list.  Patient reports no complaints. Reports fetal movement. Denies any contractions, bleeding or leaking of fluid.   The following portions of the patient's history were reviewed and updated as appropriate: allergies, current medications, past family history, past medical history, past social history, past surgical history and problem list.   Objective:   General:  Alert, oriented and cooperative.   Mental Status: Normal mood and affect perceived. Normal judgment and thought content.  Rest of physical exam deferred due to type of encounter  Assessment and Plan:  Pregnancy: G1P0 at [redacted]w[redacted]d  1. Supervision of normal first pregnancy, antepartum  Doing well, BP cuff from babyscripts did not come to her house.  She has the App I recommend she come to the office to pick up a BP cuff. Patient will come today.    There are no diagnoses linked to this encounter. Preterm labor symptoms and general obstetric precautions including but not limited to vaginal bleeding, contractions, leaking of fluid and fetal movement were reviewed in  detail with the patient.  I discussed the assessment and treatment plan with the patient. The patient was provided an opportunity to ask questions and all were answered. The patient agreed with the plan and demonstrated an understanding of the instructions. The patient was advised to call back or seek an in-person office evaluation/go to MAU at [facility] for any urgent or concerning symptoms. Please refer to After Visit Summary for other counseling recommendations.   I provided 20 minutes of non-face-to-face time during this encounter.  No follow-ups on file.  Future Appointments  Date Time Provider Department Center  12/10/2018  9:15 AM Leftwich-Kirby, Wilmer Floor, CNM [facility]    Venia Carbon, NP Center for [employer], [facility]

## 2018-11-27 ENCOUNTER — Ambulatory Visit (INDEPENDENT_AMBULATORY_CARE_PROVIDER_SITE_OTHER): Payer: Medicaid Other | Admitting: Obstetrics and Gynecology

## 2018-11-27 ENCOUNTER — Other Ambulatory Visit: Payer: Self-pay

## 2018-11-27 DIAGNOSIS — Z3A35 35 weeks gestation of pregnancy: Secondary | ICD-10-CM

## 2018-11-27 DIAGNOSIS — Z3480 Encounter for supervision of other normal pregnancy, unspecified trimester: Secondary | ICD-10-CM

## 2018-11-27 DIAGNOSIS — Z3400 Encounter for supervision of normal first pregnancy, unspecified trimester: Secondary | ICD-10-CM

## 2018-11-27 DIAGNOSIS — Z3403 Encounter for supervision of normal first pregnancy, third trimester: Secondary | ICD-10-CM

## 2018-11-27 NOTE — Progress Notes (Signed)
BP on April 15: 110/63

## 2018-11-27 NOTE — Progress Notes (Signed)
   TELEHEALTH VIRTUAL OBSTETRICS VISIT ENCOUNTER NOTE  I connected with Jennifer Griffith on 11/27/18 at  9:45 AM EDT by Web X at home and verified that I am speaking with the correct person using two identifiers.   I discussed the limitations, risks, security and privacy concerns of performing an evaluation and management service by telephone and the availability of in person appointments. I also discussed with the patient that there may be a patient responsible charge related to this service. The patient expressed understanding and agreed to proceed.  Subjective:  Jennifer Griffith is a 18 y.o. G1P0 at [redacted]w[redacted]d being followed for ongoing prenatal care.  She is currently monitored for the following issues for this low-risk pregnancy and has Supervision of normal first pregnancy, antepartum and Late Entry to Babyscripts- March 2020- Social Distancing on their problem list.  Patient reports no complaints. Reports fetal movement. Denies any contractions, bleeding or leaking of fluid.   The following portions of the patient's history were reviewed and updated as appropriate: allergies, current medications, past family history, past medical history, past social history, past surgical history and problem list.   Objective:   General:  Alert, oriented and cooperative.   Mental Status: Normal mood and affect perceived. Normal judgment and thought content.  Rest of physical exam deferred due to type of encounter  Assessment and Plan:  Pregnancy: G1P0 at [redacted]w[redacted]d  1. Supervision of normal first pregnancy, antepartum  Doing well Answered questions regarding visitor policy at [facility]  In-Office visit next week for GCC/GBS  2. Late Entry to Babyscripts- March 2020- Social Distancing  Bp's good. Reviewed Baby scripts. She will check BP today.    Preterm labor symptoms and general obstetric precautions including but not limited to vaginal bleeding, contractions, leaking of fluid and fetal movement were  reviewed in detail with the patient.  I discussed the assessment and treatment plan with the patient. The patient was provided an opportunity to ask questions and all were answered. The patient agreed with the plan and demonstrated an understanding of the instructions. The patient was advised to call back or seek an in-person office evaluation/go to MAU at [facility] for any urgent or concerning symptoms. Please refer to After Visit Summary for other counseling recommendations.   I provided 15 minutes of non-face-to-face time during this encounter.  No follow-ups on file.  Future Appointments  Date Time Provider Department Center  12/10/2018  9:15 AM Leftwich-Kirby, Wilmer Floor, CNM [facility]    Venia Carbon, NP Center for [employer], [facility]

## 2018-12-10 ENCOUNTER — Other Ambulatory Visit (HOSPITAL_COMMUNITY)
Admission: RE | Admit: 2018-12-10 | Discharge: 2018-12-10 | Disposition: A | Discharge status: Home / Self Care | Payer: Medicaid Other | Source: Ambulatory Visit | Attending: Advanced Practice Midwife | Admitting: Advanced Practice Midwife

## 2018-12-10 ENCOUNTER — Other Ambulatory Visit: Payer: Self-pay

## 2018-12-10 ENCOUNTER — Ambulatory Visit (INDEPENDENT_AMBULATORY_CARE_PROVIDER_SITE_OTHER): Payer: Medicaid Other | Admitting: Advanced Practice Midwife

## 2018-12-10 VITALS — BP 124/71 | HR 86 | Wt 209.0 lb

## 2018-12-10 DIAGNOSIS — Z3480 Encounter for supervision of other normal pregnancy, unspecified trimester: Secondary | ICD-10-CM

## 2018-12-10 DIAGNOSIS — Z3483 Encounter for supervision of other normal pregnancy, third trimester: Secondary | ICD-10-CM

## 2018-12-10 DIAGNOSIS — Z3A37 37 weeks gestation of pregnancy: Secondary | ICD-10-CM

## 2018-12-10 LAB — OB RESULTS CONSOLE GBS: GBS: NEGATIVE

## 2018-12-10 NOTE — Progress Notes (Signed)
   PRENATAL VISIT NOTE  Subjective:  Jennifer Griffith is a 18 y.o. G1P0 at [redacted]w[redacted]d being seen today for ongoing prenatal care.  She is currently monitored for the following issues for this low-risk pregnancy and has Supervision of normal first pregnancy, antepartum and Late Entry to Babyscripts- March 2020- Social Distancing on their problem list.  Patient reports pelvic pressure.  Contractions: Not present. Vag. Bleeding: None.  Movement: Present. Denies leaking of fluid.   The following portions of the patient's history were reviewed and updated as appropriate: allergies, current medications, past family history, past medical history, past social history, past surgical history and problem list.   Objective:   Vitals:   12/10/18 0906  BP: 124/71  Pulse: 86  Weight: 94.8 kg    Fetal Status: Fetal Heart Rate (bpm): 143 Fundal Height: 38 cm Movement: Present  Presentation: Vertex  General:  Alert, oriented and cooperative. Patient is in no acute distress.  Skin: Skin is warm and dry. No rash noted.   Cardiovascular: Normal heart rate noted  Respiratory: Normal respiratory effort, no problems with respiration noted  Abdomen: Soft, gravid, appropriate for gestational age.  Pain/Pressure: Present     Pelvic: Cervical exam performed Dilation: Closed Effacement (%): 50 Station: -3 Vertex position noted by [employer].  Extremities: Normal range of motion.  Edema: Trace  Mental Status: Normal mood and affect. Normal behavior. Normal judgment and thought content.   Assessment and Plan:  Pregnancy: G1P0 at [redacted]w[redacted]d 1. Supervision of other normal pregnancy, antepartum --Anticipatory guidance about next visits/weeks of pregnancy given. --Reviewed safety, visitor policy, reassurance about COVID-19 for pregnancy at this time. Discussed possible changes to visits, including televisits, that may occur due to COVID-19.  The office remains open if pt needs to be seen and MAU is open 24 hours/day for OB  emergencies.   - Culture, beta strep (group b only) - Cervicovaginal ancillary only( [place])  Term labor symptoms and general obstetric precautions including but not limited to vaginal bleeding, contractions, leaking of fluid and fetal movement were reviewed in detail with the patient. Please refer to After Visit Summary for other counseling recommendations.   Return in about 1 week (around 12/17/2018).  No future appointments.  Sharen Counter, CNM

## 2018-12-10 NOTE — Patient Instructions (Signed)
Labor Precautions Reasons to come to [facility]U at  [facility]:  1.  Contractions are  5 minutes apart or less, each last 1 minute, these have been going on for 1-2 hours, and you cannot walk or talk during them 2.  You have a large gush of fluid, or a trickle of fluid that will not stop and you have to wear a pad 3.  You have bleeding that is bright red, heavier than spotting--like menstrual bleeding (spotting can be normal in early labor or after a check of your cervix) 4.  You do not feel the baby moving like he/she normally does  Pain Relief During Labor and Delivery Many things can cause pain during labor and delivery, including:  Pressure on bones and ligaments due to the baby moving through the pelvis.  Stretching of tissues due to the baby moving through the birth canal.  Muscle tension due to anxiety or nervousness.  The uterus tightening (contracting) and relaxing to help move the baby. There are many ways to deal with the pain of labor and delivery. They include:  Taking prenatal classes. Taking these classes helps you know what to expect during your baby's birth. What you learn will increase your confidence and decrease your anxiety.  Practicing relaxation techniques or doing relaxing activities, such as: ? Focused breathing. ? Meditation. ? Visualization. ? Aroma therapy. ? Listening to your favorite music. ? Hypnosis.  Taking a warm shower or bath (hydrotherapy). This may: ? Provide comfort and relaxation. ? Lessen your perception of pain. ? Decrease the amount of pain medicine needed. ? Decrease the length of labor.  Getting a massage or counterpressure on your back.  Applying warm packs or ice packs.  Changing positions often, moving around, or using a birthing ball.  Getting: ? Pain medicine through an IV or injection into a muscle. ? Pain medicine inserted into your spinal column. ? Injections of sterile water just under the skin on  your lower back (intradermal injections). ? Laughing gas (nitrous oxide). Discuss your pain control options with your health care provider during your prenatal visits. Explore the options offered by your hospital or birth center. What kinds of medicine are available? There are two kinds of medicines that can be used to relieve pain during labor and delivery:  Analgesics. These medicines decrease pain without causing you to lose feeling or the ability to move your muscles.  Anesthetics. These medicines block feeling in the body and can decrease your ability to move freely. Both of these kinds of medicine can cause minor side effects, such as nausea, trouble concentrating, and sleepiness. They can also decrease the baby's heart rate before birth and affect the baby's breathing rate after birth. For this reason, health care providers are careful about when and how much medicine is given. What are specific medicines and procedures that provide pain relief? Local Anesthetics Local anesthetics are used to numb a small area of the body. They may be used along with another kind of anesthetic or used to numb the nerves of the vagina, cervix, and perineum during the second stage of labor. General Anesthetics General anesthetics cause you to lose consciousness so you do not feel pain. They are usually only used for an emergency cesarean delivery. General anesthetics are given through an IV tube and a mask. Pudendal Block A pudendal block is a form of local anesthetic. It may be used to relieve the pain associated with pushing or stretching of the perineum   at the time of delivery or to further numb the perineum. A pudendal block is done by injecting numbing medicine through the vaginal wall into a nerve in the pelvis. Epidural Analgesia Epidural analgesia is given through a flexible IV catheter that is inserted into the lower back. Numbing medicine is delivered continuously to the area near your spinal column  nerves (epidural space). After having this type of analgesia, you may be able to move your legs but you most likely will not be able to walk. Depending on the amount of medicine given, you may lose all feeling in the lower half of your body, or you may retain some level of sensation, including the urge to push. Epidural analgesia can be used to provide pain relief for a vaginal birth. Spinal Block A spinal block is similar to epidural analgesia, but the medicine is injected into the spinal fluid instead of the epidural space. A spinal block is only given once. It starts to relieve pain quickly, but the pain relief lasts only 1-6 hours. Spinal blocks can be used for cesarean deliveries. Combined Spinal-Epidural (CSE) Block A CSE block combines the effects of a spinal block and epidural analgesia. The spinal block works quickly to block all pain. The epidural analgesia provides continuous pain relief, even after the effects of the spinal block have worn off. This information is not intended to replace advice given to you by your health care provider. Make sure you discuss any questions you have with your health care provider. Document Released: 11/09/2008 Document Revised: 12/31/2015 Document Reviewed: 12/15/2015 Elsevier Interactive Patient Education  2019 Elsevier Inc.   

## 2018-12-11 LAB — CERVICOVAGINAL ANCILLARY ONLY
Chlamydia: NEGATIVE
Neisseria Gonorrhea: NEGATIVE

## 2018-12-13 LAB — CULTURE, BETA STREP (GROUP B ONLY)
MICRO NUMBER:: 446751
SPECIMEN QUALITY:: ADEQUATE

## 2018-12-20 ENCOUNTER — Other Ambulatory Visit: Payer: Self-pay

## 2018-12-20 ENCOUNTER — Ambulatory Visit (INDEPENDENT_AMBULATORY_CARE_PROVIDER_SITE_OTHER): Payer: Medicaid Other

## 2018-12-20 DIAGNOSIS — Z3400 Encounter for supervision of normal first pregnancy, unspecified trimester: Secondary | ICD-10-CM

## 2018-12-20 DIAGNOSIS — Z3403 Encounter for supervision of normal first pregnancy, third trimester: Secondary | ICD-10-CM

## 2018-12-20 DIAGNOSIS — Z3A38 38 weeks gestation of pregnancy: Secondary | ICD-10-CM

## 2018-12-20 NOTE — Progress Notes (Signed)
Pt's BP cuff kept giving her error signal. She will try to retake BP later.

## 2018-12-20 NOTE — Progress Notes (Signed)
   TELEHEALTH VIRTUAL OBSTETRICS VISIT ENCOUNTER NOTE  I connected with Leva Chivers on 12/20/18 at  9:00 AM EDT by WebEx at home and verified that I am speaking with the correct person using two identifiers.   I discussed the limitations, risks, security and privacy concerns of performing an evaluation and management service by telephone and the availability of in person appointments. I also discussed with the patient that there may be a patient responsible charge related to this service. The patient expressed understanding and agreed to proceed.  Subjective:  Jennifer Griffith is a 18 y.o. G1P0 at [redacted]w[redacted]d being followed for ongoing prenatal care.  She is currently monitored for the following issues for this low-risk pregnancy and has Supervision of normal first pregnancy, antepartum and Late Entry to Babyscripts- March 2020- Social Distancing on their problem list.  Patient reports no complaints. Reports fetal movement. Denies any contractions, bleeding or leaking of fluid.   The following portions of the patient's history were reviewed and updated as appropriate: allergies, current medications, past family history, past medical history, past social history, past surgical history and problem list.   Objective:   General:  Alert, oriented and cooperative.   Mental Status: Normal mood and affect perceived. Normal judgment and thought content.  Rest of physical exam deferred due to type of encounter  Assessment and Plan:  Pregnancy: G1P0 at [redacted]w[redacted]d 1. Supervision of normal first pregnancy, antepartum -BP 112/74 -Location of [facility] reviewed and COVID visitor restrictions reviewed. -Discussed IOL at 41 weeks if not delivered. -Patient strongly desires immediate PP IUD  Term labor symptoms and general obstetric precautions including but not limited to vaginal bleeding, contractions, leaking of fluid and fetal movement were reviewed in detail with the patient.  I discussed the assessment and  treatment plan with the patient. The patient was provided an opportunity to ask questions and all were answered. The patient agreed with the plan and demonstrated an understanding of the instructions. The patient was advised to call back or seek an in-person office evaluation/go to MAU at [facility] for any urgent or concerning symptoms. Please refer to After Visit Summary for other counseling recommendations.   I provided 15 minutes of non-face-to-face time during this encounter.  Return in about 1 week (around 12/27/2018) for Return OB visit.  No future appointments.  Rolm Bookbinder, CNM [facility] [employer], [facility]

## 2018-12-23 ENCOUNTER — Inpatient Hospital Stay (HOSPITAL_COMMUNITY)
Admission: AD | Admit: 2018-12-23 | Discharge: 2018-12-23 | Disposition: A | Discharge status: Home / Self Care | Payer: Medicaid Other | Attending: Family Medicine | Admitting: Family Medicine

## 2018-12-23 ENCOUNTER — Encounter (HOSPITAL_COMMUNITY): Payer: Self-pay

## 2018-12-23 ENCOUNTER — Other Ambulatory Visit: Payer: Self-pay

## 2018-12-23 DIAGNOSIS — O26893 Other specified pregnancy related conditions, third trimester: Secondary | ICD-10-CM | POA: Insufficient documentation

## 2018-12-23 DIAGNOSIS — Z0371 Encounter for suspected problem with amniotic cavity and membrane ruled out: Secondary | ICD-10-CM

## 2018-12-23 DIAGNOSIS — Z3A39 39 weeks gestation of pregnancy: Secondary | ICD-10-CM | POA: Diagnosis not present

## 2018-12-23 NOTE — MAU Provider Note (Signed)
First Provider Initiated Contact with Patient 12/23/18 1302       S: Ms. Jennifer Griffith is a 18 y.o. G1P0 at [redacted]w[redacted]d  who presents to MAU today complaining of leaking of fluid since this morning. She denies vaginal bleeding. She denies contractions. She reports normal fetal movement.    O: BP 118/69   Temp 98 F (36.7 C) (Oral)   Resp 16   Ht 5\' 3"  (1.6 m)   Wt 97.3 kg   LMP 03/24/2018 (LMP Unknown)   SpO2 99%   BMI 37.98 kg/m  GENERAL: Well-developed, well-nourished female in no acute distress.  HEAD: Normocephalic, atraumatic.  CHEST: Normal effort of breathing, regular heart rate ABDOMEN: Soft, nontender, gravid PELVIC: Normal external female genitalia. Vagina is pink and rugated. Cervix with normal contour, no lesions. Normal discharge.  No pooling.   Cervical exam: deferred   Fetal Monitoring: Baseline: 135 Variability: moderate Accelerations: 15x15 Decelerations: none Contractions: none  Fern test negative   A: SIUP at [redacted]w[redacted]d  Membranes intact  P: Discharge home in stable condition  Discussed reasons to return to MAU  Judeth Horn, NP 12/23/2018 8:29 PM

## 2018-12-23 NOTE — MAU Note (Signed)
Pt felt a gush of water last night. Underwear was damp this morning, not wearing a pad. No recent intercourse. +FM

## 2018-12-23 NOTE — Discharge Instructions (Signed)
Fetal Movement Counts °Patient Name: ________________________________________________ Patient Due Date: ____________________ °What is a fetal movement count? ° °A fetal movement count is the number of times that you feel your baby move during a certain amount of time. This may also be called a fetal kick count. A fetal movement count is recommended for every pregnant woman. You may be asked to start counting fetal movements as early as week 28 of your pregnancy. °Pay attention to when your baby is most active. You may notice your baby's sleep and wake cycles. You may also notice things that make your baby move more. You should do a fetal movement count: °· When your baby is normally most active. °· At the same time each day. °A good time to count movements is while you are resting, after having something to eat and drink. °How do I count fetal movements? °1. Find a quiet, comfortable area. Sit, or lie down on your side. °2. Write down the date, the start time and stop time, and the number of movements that you felt between those two times. Take this information with you to your health care visits. °3. For 2 hours, count kicks, flutters, swishes, rolls, and jabs. You should feel at least 10 movements during 2 hours. °4. You may stop counting after you have felt 10 movements. °5. If you do not feel 10 movements in 2 hours, have something to eat and drink. Then, keep resting and counting for 1 hour. If you feel at least 4 movements during that hour, you may stop counting. °Contact a health care provider if: °· You feel fewer than 4 movements in 2 hours. °· Your baby is not moving like he or she usually does. °Date: ____________ Start time: ____________ Stop time: ____________ Movements: ____________ °Date: ____________ Start time: ____________ Stop time: ____________ Movements: ____________ °Date: ____________ Start time: ____________ Stop time: ____________ Movements: ____________ °Date: ____________ Start time:  ____________ Stop time: ____________ Movements: ____________ °Date: ____________ Start time: ____________ Stop time: ____________ Movements: ____________ °Date: ____________ Start time: ____________ Stop time: ____________ Movements: ____________ °Date: ____________ Start time: ____________ Stop time: ____________ Movements: ____________ °Date: ____________ Start time: ____________ Stop time: ____________ Movements: ____________ °Date: ____________ Start time: ____________ Stop time: ____________ Movements: ____________ °This information is not intended to replace advice given to you by your health care provider. Make sure you discuss any questions you have with your health care provider. °Document Released: 08/23/2006 Document Revised: 03/22/2016 Document Reviewed: 09/02/2015 °Elsevier Interactive Patient Education © 2019 Elsevier Inc. °Signs and Symptoms of Labor °Labor is your body's natural process of moving your baby, placenta, and umbilical cord out of your uterus. The process of labor usually starts when your baby is full-term, between 37 and 40 weeks of pregnancy. °How will I know when I am close to going into labor? °As your body prepares for labor and the birth of your baby, you may notice the following symptoms in the weeks and days before true labor starts: °· Having a strong desire to get your home ready to receive your new baby. This is called nesting. Nesting may be a sign that labor is approaching, and it may occur several weeks before birth. Nesting may involve cleaning and organizing your home. °· Passing a small amount of thick, bloody mucus out of your vagina (normal bloody show or losing your mucus plug). This may happen more than a week before labor begins, or it might occur right before labor begins as the opening of the cervix   starts to widen (dilate). For some women, the entire mucus plug passes at once. For others, smaller portions of the mucus plug may gradually pass over several  days. °· Your baby moving (dropping) lower in your pelvis to get into position for birth (lightening). When this happens, you may feel more pressure on your bladder and pelvic bone and less pressure on your ribs. This may make it easier to breathe. It may also cause you to need to urinate more often and have problems with bowel movements. °· Having "practice contractions" (Braxton Hicks contractions) that occur at irregular (unevenly spaced) intervals that are more than 10 minutes apart. This is also called false labor. False labor contractions are common after exercise or sexual activity, and they will stop if you change position, rest, or drink fluids. These contractions are usually mild and do not get stronger over time. They may feel like: °? A backache or back pain. °? Mild cramps, similar to menstrual cramps. °? Tightening or pressure in your abdomen. °Other early symptoms that labor may be starting soon include: °· Nausea or loss of appetite. °· Diarrhea. °· Having a sudden burst of energy, or feeling very tired. °· Mood changes. °· Having trouble sleeping. °How will I know when labor has begun? °Signs that true labor has begun may include: °· Having contractions that come at regular (evenly spaced) intervals and increase in intensity. This may feel like more intense tightening or pressure in your abdomen that moves to your back. °? Contractions may also feel like rhythmic pain in your upper thighs or back that comes and goes at regular intervals. °? For first-time mothers, this change in intensity of contractions often occurs at a more gradual pace. °? Women who have given birth before may notice a more rapid progression of contraction changes. °· Having a feeling of pressure in the vaginal area. °· Your water breaking (rupture of membranes). This is when the sac of fluid that surrounds your baby breaks. When this happens, you will notice fluid leaking from your vagina. This may be clear or blood-tinged.  Labor usually starts within 24 hours of your water breaking, but it may take longer to begin. °? Some women notice this as a gush of fluid. °? Others notice that their underwear repeatedly becomes damp. °Follow these instructions at home: ° °· When labor starts, or if your water breaks, call your health care provider or nurse care line. Based on your situation, they will determine when you should go in for an exam. °· When you are in early labor, you may be able to rest and manage symptoms at home. Some strategies to try at home include: °? Breathing and relaxation techniques. °? Taking a warm bath or shower. °? Listening to music. °? Using a heating pad on the lower back for pain. If you are directed to use heat: °§ Place a towel between your skin and the heat source. °§ Leave the heat on for 20-30 minutes. °§ Remove the heat if your skin turns bright red. This is especially important if you are unable to feel pain, heat, or cold. You may have a greater risk of getting burned. °Get help right away if: °· You have painful, regular contractions that are 5 minutes apart or less. °· Labor starts before you are [redacted] weeks along in your pregnancy. °· You have a fever. °· You have a headache that does not go away. °· You have bright red blood coming from your vagina. °·   You do not feel your baby moving. °· You have a sudden onset of: °? Severe headache with vision problems. °? Nausea, vomiting, or diarrhea. °? Chest pain or shortness of breath. °These symptoms may be an emergency. If your health care provider recommends that you go to the hospital or birth center where you plan to deliver, do not drive yourself. Have someone else drive you, or call emergency services (911 in the [place] °Summary °· Labor is your body's natural process of moving your baby, placenta, and umbilical cord out of your uterus. °· The process of labor usually starts when your baby is full-term, between 37 and 40 weeks of pregnancy. °· When labor  starts, or if your water breaks, call your health care provider or nurse care line. Based on your situation, they will determine when you should go in for an exam. °This information is not intended to replace advice given to you by your health care provider. Make sure you discuss any questions you have with your health care provider. °Document Released: 12/29/2016 Document Revised: 12/29/2016 Document Reviewed: 12/29/2016 °Elsevier Interactive Patient Education © 2019 Elsevier Inc. ° °

## 2018-12-29 ENCOUNTER — Inpatient Hospital Stay (HOSPITAL_COMMUNITY)
Admission: AD | Admit: 2018-12-29 | Discharge: 2019-01-01 | DRG: 805 | Disposition: A | Discharge status: Home / Self Care | Payer: Medicaid Other | Source: Ambulatory Visit | Attending: Obstetrics & Gynecology | Admitting: Obstetrics & Gynecology

## 2018-12-29 ENCOUNTER — Other Ambulatory Visit: Payer: Self-pay

## 2018-12-29 ENCOUNTER — Encounter (HOSPITAL_COMMUNITY): Payer: Self-pay

## 2018-12-29 DIAGNOSIS — Z30430 Encounter for insertion of intrauterine contraceptive device: Secondary | ICD-10-CM

## 2018-12-29 DIAGNOSIS — O4202 Full-term premature rupture of membranes, onset of labor within 24 hours of rupture: Secondary | ICD-10-CM | POA: Diagnosis not present

## 2018-12-29 DIAGNOSIS — Z1159 Encounter for screening for other viral diseases: Secondary | ICD-10-CM | POA: Diagnosis not present

## 2018-12-29 DIAGNOSIS — O411230 Chorioamnionitis, third trimester, not applicable or unspecified: Secondary | ICD-10-CM | POA: Diagnosis present

## 2018-12-29 DIAGNOSIS — D649 Anemia, unspecified: Secondary | ICD-10-CM | POA: Diagnosis not present

## 2018-12-29 DIAGNOSIS — O480 Post-term pregnancy: Secondary | ICD-10-CM | POA: Diagnosis not present

## 2018-12-29 DIAGNOSIS — O4292 Full-term premature rupture of membranes, unspecified as to length of time between rupture and onset of labor: Secondary | ICD-10-CM | POA: Diagnosis present

## 2018-12-29 DIAGNOSIS — O411290 Chorioamnionitis, unspecified trimester, not applicable or unspecified: Secondary | ICD-10-CM | POA: Diagnosis not present

## 2018-12-29 DIAGNOSIS — Z3A00 Weeks of gestation of pregnancy not specified: Secondary | ICD-10-CM | POA: Diagnosis not present

## 2018-12-29 DIAGNOSIS — Z3A40 40 weeks gestation of pregnancy: Secondary | ICD-10-CM

## 2018-12-29 DIAGNOSIS — O9902 Anemia complicating childbirth: Secondary | ICD-10-CM | POA: Diagnosis not present

## 2018-12-29 DIAGNOSIS — O4290 Premature rupture of membranes, unspecified as to length of time between rupture and onset of labor, unspecified weeks of gestation: Secondary | ICD-10-CM

## 2018-12-29 DIAGNOSIS — Z3400 Encounter for supervision of normal first pregnancy, unspecified trimester: Secondary | ICD-10-CM

## 2018-12-29 LAB — CBC
HCT: 33.1 % — ABNORMAL LOW (ref 36.0–46.0)
Hemoglobin: 10.6 g/dL — ABNORMAL LOW (ref 12.0–15.0)
MCH: 24 pg — ABNORMAL LOW (ref 26.0–34.0)
MCHC: 32 g/dL (ref 30.0–36.0)
MCV: 75.1 fL — ABNORMAL LOW (ref 80.0–100.0)
Platelets: 215 10*3/uL (ref 150–400)
RBC: 4.41 MIL/uL (ref 3.87–5.11)
RDW: 17.2 % — ABNORMAL HIGH (ref 11.5–15.5)
WBC: 11.5 10*3/uL — ABNORMAL HIGH (ref 4.0–10.5)
nRBC: 0 % (ref 0.0–0.2)

## 2018-12-29 LAB — SARS CORONAVIRUS 2 BY RT PCR (HOSPITAL ORDER, PERFORMED IN ~~LOC~~ HOSPITAL LAB): SARS Coronavirus 2: NEGATIVE

## 2018-12-29 LAB — ABO/RH: ABO/RH(D): O POS

## 2018-12-29 LAB — TYPE AND SCREEN
ABO/RH(D): O POS
Antibody Screen: NEGATIVE

## 2018-12-29 LAB — RPR: RPR Ser Ql: NONREACTIVE

## 2018-12-29 LAB — POCT FERN TEST
POCT Fern Test: NEGATIVE
POCT Fern Test: POSITIVE

## 2018-12-29 MED ORDER — LEVONORGESTREL 19.5 MCG/DAY IU IUD
INTRAUTERINE_SYSTEM | Freq: Once | INTRAUTERINE | Status: AC
  Administered 2018-12-30: 1 via INTRAUTERINE
  Filled 2018-12-29: qty 1

## 2018-12-29 MED ORDER — FENTANYL-BUPIVACAINE-NACL 0.5-0.125-0.9 MG/250ML-% EP SOLN
12.0000 mL/h | EPIDURAL | Status: DC | PRN
  Filled 2018-12-29: qty 250

## 2018-12-29 MED ORDER — FENTANYL CITRATE (PF) 100 MCG/2ML IJ SOLN: 100.0000 ug | INTRAMUSCULAR | Status: DC | PRN

## 2018-12-29 MED ORDER — DIPHENHYDRAMINE HCL 50 MG/ML IJ SOLN: 12.5000 mg | INTRAMUSCULAR | Status: DC | PRN

## 2018-12-29 MED ORDER — SOD CITRATE-CITRIC ACID 500-334 MG/5ML PO SOLN: 30.0000 mL | ORAL | Status: DC | PRN

## 2018-12-29 MED ORDER — LIDOCAINE HCL (PF) 1 % IJ SOLN: 30.0000 mL | INTRAMUSCULAR | Status: DC | PRN

## 2018-12-29 MED ORDER — TERBUTALINE SULFATE 1 MG/ML IJ SOLN: 0.2500 mg | Freq: Once | INTRAMUSCULAR | Status: DC | PRN

## 2018-12-29 MED ORDER — OXYTOCIN 40 UNITS IN NORMAL SALINE INFUSION - SIMPLE MED
2.5000 [IU]/h | INTRAVENOUS | Status: DC
  Administered 2018-12-30: 09:00:00 2.5 [IU]/h via INTRAVENOUS

## 2018-12-29 MED ORDER — EPHEDRINE 5 MG/ML INJ: 10.0000 mg | INTRAVENOUS | Status: DC | PRN

## 2018-12-29 MED ORDER — LACTATED RINGERS IV SOLN
500.0000 mL | Freq: Once | INTRAVENOUS | Status: AC
  Administered 2018-12-29: 23:00:00 500 mL via INTRAVENOUS

## 2018-12-29 MED ORDER — MISOPROSTOL 50MCG HALF TABLET
50.0000 ug | ORAL_TABLET | ORAL | Status: DC
  Administered 2018-12-29 (×2): 50 ug via BUCCAL
  Filled 2018-12-29: qty 1

## 2018-12-29 MED ORDER — ONDANSETRON HCL 4 MG/2ML IJ SOLN
4.0000 mg | Freq: Four times a day (QID) | INTRAMUSCULAR | Status: DC | PRN
  Administered 2018-12-29: 22:00:00 4 mg via INTRAVENOUS
  Filled 2018-12-29: qty 2

## 2018-12-29 MED ORDER — FLEET ENEMA 7-19 GM/118ML RE ENEM: 1.0000 | ENEMA | RECTAL | Status: DC | PRN

## 2018-12-29 MED ORDER — LACTATED RINGERS IV SOLN: 500.0000 mL | INTRAVENOUS | Status: DC | PRN

## 2018-12-29 MED ORDER — OXYTOCIN 40 UNITS IN NORMAL SALINE INFUSION - SIMPLE MED
1.0000 m[IU]/min | INTRAVENOUS | Status: DC
  Administered 2018-12-29: 19:00:00 2 m[IU]/min via INTRAVENOUS
  Filled 2018-12-29: qty 1000

## 2018-12-29 MED ORDER — MISOPROSTOL 25 MCG QUARTER TABLET
ORAL_TABLET | ORAL | Status: AC
  Filled 2018-12-29: qty 1

## 2018-12-29 MED ORDER — PHENYLEPHRINE 40 MCG/ML (10ML) SYRINGE FOR IV PUSH (FOR BLOOD PRESSURE SUPPORT)
80.0000 ug | PREFILLED_SYRINGE | INTRAVENOUS | Status: DC | PRN
  Filled 2018-12-29: qty 10

## 2018-12-29 MED ORDER — LACTATED RINGERS IV SOLN
INTRAVENOUS | Status: DC
  Administered 2018-12-29 – 2018-12-30 (×4): via INTRAVENOUS

## 2018-12-29 MED ORDER — PHENYLEPHRINE 40 MCG/ML (10ML) SYRINGE FOR IV PUSH (FOR BLOOD PRESSURE SUPPORT): 80.0000 ug | PREFILLED_SYRINGE | INTRAVENOUS | Status: DC | PRN

## 2018-12-29 MED ORDER — OXYTOCIN BOLUS FROM INFUSION
500.0000 mL | Freq: Once | INTRAVENOUS | Status: AC
  Administered 2018-12-30: 09:00:00 500 mL via INTRAVENOUS

## 2018-12-29 NOTE — H&P (Signed)
OBSTETRIC ADMISSION HISTORY AND PHYSICAL  Jennifer Griffith is a 18 y.o. female G1P0 with IUP at 7847w0d by L/12 presenting for concern for ROM. States got up around 0400 to use bathroom and noticed a lot of fluid leaking. Has continued to have some leaking since that time.   Reports fetal movement. Denies vaginal bleeding, contractions.  She received her prenatal care at [facility].  Support person in labor: Homero - FOB  Ultrasounds . 12w0: viability U/S . 19w6: anatomy U/S, EFW 52%, anterior placenta, normal cervical length, normal anatomy but some incompletely visualized parts . 23w6: EFW 45%, normal interval growth  Prenatal History/Complications: . obesity  Past Medical History: Past Medical History:  Diagnosis Date  . Medical history non-contributory     Past Surgical History: Past Surgical History:  Procedure Laterality Date  . OVARIAN CYST REMOVAL      Obstetrical History: OB History    Gravida  1   Para      Term      Preterm      AB      Living        SAB      TAB      Ectopic      Multiple      Live Births              Social History: Social History   Socioeconomic History  . Marital status: Significant Other    Spouse name: Not on file  . Number of children: Not on file  . Years of education: current college  . Highest education level: 12th grade  Occupational History  . Occupation: Engineering geologistetail  . Occupation: Paramedicffice work  Engineer, productionocial Needs  . Financial resource strain: Not very hard  . Food insecurity:    Worry: Never true    Inability: Never true  . Transportation needs:    Medical: No    Non-medical: No  Tobacco Use  . Smoking status: Never Smoker  . Smokeless tobacco: Never Used  Substance and Sexual Activity  . Alcohol use: Not Currently    Frequency: Never  . Drug use: Never  . Sexual activity: Not Currently    Birth control/protection: None  Lifestyle  . Physical activity:    Days per week: 3 days     Minutes per session: 60 min  . Stress: Only a little  Relationships  . Social connections:    Talks on phone: More than three times a week    Gets together: More than three times a week    Attends religious service: More than 4 times per year    Active member of club or organization: Yes    Attends meetings of clubs or organizations: More than 4 times per year    Relationship status: Never married  Other Topics Concern  . Not on file  Social History Narrative  . Not on file    Family History: History reviewed. No pertinent family history.  Allergies: No Known Allergies  Medications Prior to Admission  Medication Sig Dispense Refill Last Dose  . Prenatal Multivit-Min-Fe-FA (PRENATAL VITAMINS PO) Take 1 tablet by mouth daily.   Taking     Review of Systems  All systems reviewed and negative except as stated in HPI  Last menstrual period 03/24/2018. General appearance: alert, well-appearing, NAD Lungs: no respiratory distress Heart: regular rate  Abdomen: soft, non-tender; gravid  Pelvic: normal external labia  normal-appearing vaginal mucosa  moderate amount of pooling noted in vault, increased fluid  with valsalva (appears clear) Extremities: no significant LE edema Presentation: cephalic by sututres Fetal monitoring: 135/mod/+a/-d Uterine activity: irregular, irritable Dilation: Fingertip Effacement (%): 50 Station: -3 Exam by:: L. Earlene Plater DO  Fern (+)  Prenatal labs: ABO, Rh: O/Positive/-- (10/28 0941) Antibody: Negative (10/28 0941) Rubella: 1.52 (10/28 0941) RPR: Non Reactive (03/04 0921)  HBsAg: Negative (10/28 0941)  HIV: Non Reactive (03/04 0921)  GBS: Negative (05/05 0000)  Glucola: normal 2-hr Genetic screening:  Low risk female   Prenatal Transfer Tool  Maternal Diabetes: No Genetic Screening: Normal Maternal Ultrasounds/Referrals: Normal Fetal Ultrasounds or other Referrals:  None Maternal Substance Abuse:  No Significant Maternal  Medications:  None Significant Maternal Lab Results: None  Results for orders placed or performed during the hospital encounter of 12/29/18 (from the past 24 hour(s))  POCT fern test   Collection Time: 12/29/18  6:29 AM  Result Value Ref Range   POCT Fern Test Negative = intact amniotic membranes   Fern Test   Collection Time: 12/29/18  7:04 AM  Result Value Ref Range   POCT Fern Test Positive = ruptured amniotic membanes     Patient Active Problem List   Diagnosis Date Noted  . PROM (premature rupture of membranes) 12/29/2018  . 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 Supervision of normal first pregnancy, antepartum 06/03/2018    Assessment/Plan:  Deborh Mandel is a 18 y.o. G1P0 at [redacted]w[redacted]d here for PROM.   Labor: Induction for PROM. Defer to L&D team re: method of induction. Patient would prefer to be as intervention free as possible. ROM clear fluid around 0400.  Fetal Wellbeing: EFW 7-8lbs by Leopold's. Cephalic by sutures.  -- GBS (negative) -- continuous fetal monitoring - category I   Postpartum Planning -- breast/PP IUD -- RI/[x] Tdap   Felica Chargois S. Earlene Plater, DO OB/GYN Fellow

## 2018-12-29 NOTE — Plan of Care (Signed)

## 2018-12-29 NOTE — Progress Notes (Signed)
LABOR PROGRESS NOTE  Jennifer Griffith is a 18 y.o. G1P0 at [redacted]w[redacted]d  admitted for PROM.   Subjective: Patient walking around the room, attempting to find pain relief. Not quite ready to get epidural.   Objective: BP 130/66   Pulse 74   Temp 99.1 F (37.3 C) (Oral)   Resp 19   Ht 5\' 3"  (1.6 m)   Wt 97.5 kg   LMP 03/24/2018 (LMP Unknown)   SpO2 100% Comment: room air   BMI 38.06 kg/m  or  Vitals:   12/29/18 2155 12/29/18 2200 12/29/18 2234 12/29/18 2307  BP: 131/87 132/89 130/79 130/66  Pulse: 70 72 74 74  Resp: 19 19 19 19   Temp:      TempSrc:      SpO2:      Weight:      Height:        Dilation: 4 Effacement (%): 60 Cervical Position: Posterior Station: -2 Presentation: Vertex Exam by:: M.Bhambri,CNM FHT: baseline rate 130, moderate varibility, +acel, no decel Toco: q2-3 min   Labs: Lab Results  Component Value Date   WBC 11.5 (H) 12/29/2018   HGB 10.6 (L) 12/29/2018   HCT 33.1 (L) 12/29/2018   MCV 75.1 (L) 12/29/2018   PLT 215 12/29/2018    Patient Active Problem List   Diagnosis Date Noted  . PROM (premature rupture of membranes) 12/29/2018  . 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 Supervision of normal first pregnancy, antepartum 06/03/2018    Assessment / Plan: 17 y.o. G1P0 at [redacted]w[redacted]d here for PROM.   Labor: S/p cytotec and FB. Currently on Pitocin at 8 mu/min with regular ctx pattern and starting to become more uncomfortable.  Fetal Wellbeing:  Cat I  Pain Control:  Epidural upon maternal request  Anticipated MOD:  NSVD   Marcy Siren, D.O. OB Fellow  12/29/2018, 11:43 PM

## 2018-12-29 NOTE — Progress Notes (Signed)
Labor Progress Note Jennifer Griffith is a 18 y.o. G1P0 at 108w0d presented for PROM at term  S:  Feeling some ctx, declines needs for pain meds yet.  O:  BP 126/74   Pulse 64   Temp 98.9 F (37.2 C) (Oral)   Resp 18   Ht 5\' 3"  (1.6 m)   Wt 97.5 kg   LMP 03/24/2018 (LMP Unknown)   SpO2 100% Comment: room air   BMI 38.06 kg/m  EFM: baseline 135 bpm/ mod variability/ + accels/ no decels  Toco: 2-3 SVE: Dilation: 4 Effacement (%): 60 Cervical Position: Posterior Station: -2 Presentation: Vertex Exam by:: M.Demeisha Geraghty,CNM  A/P: 18 y.o. G1P0 [redacted]w[redacted]d  1. Labor: latent 2. FWB: Cat I 3. Pain: analgesia/anesthesia prn  Will start Pitocin. Anticipate labor progression and SVD .  Donette Larry, CNM 6:57 PM

## 2018-12-29 NOTE — MAU Note (Signed)
Presents with poss ROM @ 0230. No vag bldg. Pos FM. Pt with irreg cxts.   Adah Perl RN

## 2018-12-29 NOTE — Progress Notes (Signed)
Patient initially requested epidural for pain rating 9/10. Up to bathroom, patient now decides to wait, states 'I can deal with these better while walking, can I just walk in the room?" Explained to pt that walking in room was fine, instructed to call out if she changed her mind or needed anything. Patient verbalized understanding. Will continue to monitor.

## 2018-12-29 NOTE — Progress Notes (Signed)
Labor Progress Note Shanada Aerts is a 18 y.o. G1P0 at [redacted]w[redacted]d presented for PROM at term  S:  Comfortable, feeling tightening but not painful.  O:  BP 108/72   Pulse 72   Temp 98.9 F (37.2 C) (Oral)   Resp 16   Ht 5\' 3"  (1.6 m)   Wt 97.5 kg   LMP 03/24/2018 (LMP Unknown)   SpO2 100% Comment: room air   BMI 38.06 kg/m  EFM: baseline 130 bpm/ mod variability/ + accels/ no decels  Toco: q2 SVE: Dilation: 1.5 Effacement (%): 70 Cervical Position: Posterior Station: -2 Presentation: Vertex Exam by:: Fabian November  A/P: 18 y.o. G1P0 [redacted]w[redacted]d  1. Labor: latent 2. FWB: Cat I 3. Pain: analgesia/anesthesia prn  FB placed, continue Cytotec. Anticipate labor progress and SVD.  Donette Larry, CNM 2:40 PM

## 2018-12-29 NOTE — Progress Notes (Signed)
Labor Progress Note Jennifer Griffith is a 18 y.o. G1P0 at [redacted]w[redacted]d presented for PROM at term  S:  Feeling some ctx, mild.  O:  BP 120/74   Pulse 67   Temp 98.9 F (37.2 C) (Oral)   Resp 16   Ht 5\' 3"  (1.6 m)   Wt 97.5 kg   LMP 03/24/2018 (LMP Unknown)   SpO2 100% Comment: room air   BMI 38.06 kg/m  EFM: baseline 130 bpm/ mod variability/ + accels/ no decels  Toco: irregular SVE: Dilation: 1.5 Effacement (%): 70 Cervical Position: Posterior Station: -2 Presentation: Vertex Exam by:: Fabian November  A/P: 18 y.o. G1P0 [redacted]w[redacted]d  1. Labor: latent 2. FWB: Cat I 3. Pain: analgesia/anesthesia prn  Attempted FB placement but unable to get in good posiiton. Will start Cytotec for ripening. Anticipate SVD.  Donette Larry, CNM 10:27 AM

## 2018-12-30 ENCOUNTER — Encounter (HOSPITAL_COMMUNITY): Payer: Self-pay | Admitting: Anesthesiology

## 2018-12-30 ENCOUNTER — Inpatient Hospital Stay (HOSPITAL_COMMUNITY): Payer: Medicaid Other | Admitting: Anesthesiology

## 2018-12-30 DIAGNOSIS — Z3A40 40 weeks gestation of pregnancy: Secondary | ICD-10-CM

## 2018-12-30 DIAGNOSIS — Z30430 Encounter for insertion of intrauterine contraceptive device: Secondary | ICD-10-CM

## 2018-12-30 DIAGNOSIS — O4202 Full-term premature rupture of membranes, onset of labor within 24 hours of rupture: Secondary | ICD-10-CM

## 2018-12-30 DIAGNOSIS — O480 Post-term pregnancy: Secondary | ICD-10-CM

## 2018-12-30 MED ORDER — ACETAMINOPHEN 325 MG PO TABS: 650.0000 mg | ORAL_TABLET | ORAL | Status: DC | PRN

## 2018-12-30 MED ORDER — SODIUM CHLORIDE 0.9% FLUSH: 3.0000 mL | Freq: Two times a day (BID) | INTRAVENOUS | Status: DC

## 2018-12-30 MED ORDER — TETANUS-DIPHTH-ACELL PERTUSSIS 5-2.5-18.5 LF-MCG/0.5 IM SUSP: 0.5000 mL | Freq: Once | INTRAMUSCULAR | Status: DC

## 2018-12-30 MED ORDER — SODIUM CHLORIDE 0.9 % IV SOLN: 250.0000 mL | INTRAVENOUS | Status: DC | PRN

## 2018-12-30 MED ORDER — LACTATED RINGERS IV SOLN: 500.0000 mL | Freq: Once | INTRAVENOUS | Status: DC

## 2018-12-30 MED ORDER — DIBUCAINE (PERIANAL) 1 % EX OINT: 1.0000 "application " | TOPICAL_OINTMENT | CUTANEOUS | Status: DC | PRN

## 2018-12-30 MED ORDER — COCONUT OIL OIL
1.0000 "application " | TOPICAL_OIL | Status: DC | PRN
  Administered 2018-12-31: 1 via TOPICAL

## 2018-12-30 MED ORDER — IBUPROFEN 600 MG PO TABS
600.0000 mg | ORAL_TABLET | Freq: Four times a day (QID) | ORAL | Status: DC
  Administered 2018-12-30 – 2019-01-01 (×9): 600 mg via ORAL
  Filled 2018-12-30 (×9): qty 1

## 2018-12-30 MED ORDER — SODIUM CHLORIDE (PF) 0.9 % IJ SOLN
INTRAMUSCULAR | Status: DC | PRN
  Administered 2018-12-30: 12 mL/h via EPIDURAL

## 2018-12-30 MED ORDER — SODIUM CHLORIDE 0.9% FLUSH: 3.0000 mL | INTRAVENOUS | Status: DC | PRN

## 2018-12-30 MED ORDER — DIPHENHYDRAMINE HCL 25 MG PO CAPS: 25.0000 mg | ORAL_CAPSULE | Freq: Four times a day (QID) | ORAL | Status: DC | PRN

## 2018-12-30 MED ORDER — SENNOSIDES-DOCUSATE SODIUM 8.6-50 MG PO TABS
2.0000 | ORAL_TABLET | ORAL | Status: DC
  Administered 2018-12-31 (×2): 2 via ORAL
  Filled 2018-12-30 (×2): qty 2

## 2018-12-30 MED ORDER — BISACODYL 10 MG RE SUPP: 10.0000 mg | Freq: Every day | RECTAL | Status: DC | PRN

## 2018-12-30 MED ORDER — WITCH HAZEL-GLYCERIN EX PADS: 1.0000 "application " | MEDICATED_PAD | CUTANEOUS | Status: DC | PRN

## 2018-12-30 MED ORDER — PRENATAL MULTIVITAMIN CH
1.0000 | ORAL_TABLET | Freq: Every day | ORAL | Status: DC
  Administered 2018-12-30 – 2019-01-01 (×3): 1 via ORAL
  Filled 2018-12-30 (×3): qty 1

## 2018-12-30 MED ORDER — MEASLES, MUMPS & RUBELLA VAC IJ SOLR: 0.5000 mL | Freq: Once | INTRAMUSCULAR | Status: DC

## 2018-12-30 MED ORDER — ONDANSETRON HCL 4 MG/2ML IJ SOLN: 4.0000 mg | INTRAMUSCULAR | Status: DC | PRN

## 2018-12-30 MED ORDER — ZOLPIDEM TARTRATE 5 MG PO TABS: 5.0000 mg | ORAL_TABLET | Freq: Every evening | ORAL | Status: DC | PRN

## 2018-12-30 MED ORDER — BENZOCAINE-MENTHOL 20-0.5 % EX AERO: 1.0000 "application " | INHALATION_SPRAY | CUTANEOUS | Status: DC | PRN

## 2018-12-30 MED ORDER — LIDOCAINE HCL (PF) 1 % IJ SOLN
INTRAMUSCULAR | Status: DC | PRN
  Administered 2018-12-30 (×2): 4 mL via EPIDURAL

## 2018-12-30 MED ORDER — FLEET ENEMA 7-19 GM/118ML RE ENEM: 1.0000 | ENEMA | Freq: Every day | RECTAL | Status: DC | PRN

## 2018-12-30 MED ORDER — SIMETHICONE 80 MG PO CHEW: 80.0000 mg | CHEWABLE_TABLET | ORAL | Status: DC | PRN

## 2018-12-30 MED ORDER — ONDANSETRON HCL 4 MG PO TABS: 4.0000 mg | ORAL_TABLET | ORAL | Status: DC | PRN

## 2018-12-30 NOTE — Discharge Summary (Signed)
Postpartum Discharge Summary     Patient Name: Jennifer Griffith DOB: Dec 02, 2000 MRN: [id number]  Date of admission: 12/29/2018 Delivering Provider: Cheral Marker   Date of discharge: 01/01/2019  Admitting diagnosis: 40wks, water broke Intrauterine pregnancy: [redacted]w[redacted]d     Secondary diagnosis:  Principal Problem:   PROM (premature rupture of membranes)  Additional problems: suspected chorio     Discharge diagnosis: Term Pregnancy Delivered                                                                                                Post partum procedures:postplacental Liletta IUD insertion  Augmentation: Pitocin, Cytotec and Foley Balloon  Complications: None  Hospital course:  Induction of Labor With Vaginal Delivery   18 y.o. yo G1P0 at [redacted]w[redacted]d was admitted to the hospital 12/29/2018 for induction of labor.  Indication for induction: PROM.  Patient had an uncomplicated labor course as follows: Membrane Rupture Time/Date: 4:00 AM ,12/29/2018   Intrapartum Procedures: Episiotomy:                                           Lacerations:  2nd degree [3]  Patient had delivery of a Viable infant.  Information for the patient's newborn:  Jeilani, Palm [[id number]]  Delivery Method: Vag-Spont   12/30/2018  Details of delivery can be found in separate delivery note. IUD was placed post-placental. Patient had a routine postpartum course. Patient is discharged home 01/01/19.  Magnesium Sulfate recieved: No BMZ received: No  Physical exam  Vitals:   12/31/18 0601 12/31/18 1503 12/31/18 2129 01/01/19 0537  BP: (!) 98/58 111/78 112/68 115/74  Pulse: 70 73 69 (!) 54  Resp: 16 17 18 18   Temp: 97.7 F (36.5 C) 98.4 F (36.9 C) 98.6 F (37 C) 97.9 F (36.6 C)  TempSrc: Oral Oral  Oral  SpO2:  97% 98% 100%  Weight:      Height:       General: alert, cooperative and no distress Lochia: appropriate Uterine Fundus: firm Incision: Healing well with no significant drainage, No  significant erythema DVT Evaluation: No evidence of DVT seen on physical exam. Negative Homan's sign. No cords or calf tenderness. No significant calf/ankle edema. Labs: Lab Results  Component Value Date   WBC 11.5 (H) 12/29/2018   HGB 10.6 (L) 12/29/2018   HCT 33.1 (L) 12/29/2018   MCV 75.1 (L) 12/29/2018   PLT 215 12/29/2018   No flowsheet data found.  Discharge instruction: per After Visit Summary and "Baby and Me Booklet".  After visit meds:  Allergies as of 01/01/2019   No Known Allergies     Medication List    TAKE these medications   ibuprofen 600 MG tablet Commonly known as:  ADVIL Take 1 tablet (600 mg total) by mouth every 6 (six) hours.   PRENATAL VITAMINS PO Take 1 tablet by mouth daily.       Diet: routine diet  Activity: Advance as tolerated. Pelvic rest for 6 weeks.  Outpatient follow up:6 weeks Follow up Appt: No future appointments. Follow up Visit: Follow-up Information    [facility] [place]. Schedule an appointment as soon as possible for a visit in 6 week(s).   Specialty:  Obstetrics and Gynecology Contact information: [address], [address] [place] [id number] [phone]         Cheral MarkerBooker, Kimberly R, CNM  [facility] Support Pool        Please schedule this patient for PP visit in: 6 weeks  Low risk pregnancy complicated by: none  Delivery mode: SVD  Anticipated Birth Control: postplacental Liletta placed 12/30/18  PP Procedures needed: none  Schedule Integrated BH visit: no  Provider: Any provider     Newborn Data: Live born female  Birth Weight:   APGAR: 8, 9  Newborn Delivery   Birth date/time:  12/30/2018 08:32:00 Delivery type:  Vaginal, Spontaneous     Baby Feeding: Breast Disposition:home with mother   01/01/2019 Donette LarryMelanie Elea Holtzclaw, CNM

## 2018-12-30 NOTE — Lactation Note (Signed)
This note was copied from a baby's chart. Lactation Consultation Note:  P1, infant is 6 hours old and has had one feeding. Mother reports that her left nipple became sore while feeding.   Mother taught hand expression and observed large drops of colostrum.  Mother has flat nipples. She was given shells.   Infant latched on quickly and needed very little adjustment. Infants top lip up and adjusted infants lower jaw. Infant sustained latch for 38 mins while I observed.   Mother advised to do breast compression. Observed infant suckling and swallowing with good rhythm. FOB at the bedside and also observed swallows and listened to all teaching.   Mother was given a #20, #24 Nipple Dorris Carnes due to flat nipple tissue, and taught to place shield on.  Advised mother to always try to place infant to bare breast before using the shield.   Advised mother to feed infant with feeding cues, feed infant 8-12 times in 24 hours and allow for cluster feeding.   Advised mother to page for staff assistance with feedings.  Lactation Brochure given with basic teaching. Parents receptive to all teaching.   Patient Name: Jennifer Griffith Today's Date: 12/30/2018 Reason for consult: Initial assessment   Maternal Data Has patient been taught Hand Expression?: Yes Does the patient have breastfeeding experience prior to this delivery?: No  Feeding Feeding Type: Breast Fed  LATCH Score Latch: Grasps breast easily, tongue down, lips flanged, rhythmical sucking.  Audible Swallowing: Spontaneous and intermittent  Type of Nipple: Flat  Comfort (Breast/Nipple): Soft / non-tender  Hold (Positioning): Assistance needed to correctly position infant at breast and maintain latch.  LATCH Score: 8  Interventions Interventions: Breast feeding basics reviewed;Assisted with latch;Skin to skin;Hand express;Breast compression;Adjust position;Support pillows;Position options;Expressed milk;Shells;Hand  pump  Lactation Tools Discussed/Used     Consult Status Consult Status: Follow-up Date: 12/31/18 Follow-up type: In-patient    Stevan Born [facility] 12/30/2018, 3:39 PM

## 2018-12-30 NOTE — Progress Notes (Signed)
LABOR PROGRESS NOTE  Jennifer Griffith is a 18 y.o. G1P0 at [redacted]w[redacted]d  admitted for PROM.   Subjective: Strip note. Discussed patient with RN. Now nearly complete, feeling some pressure.   Objective: BP 115/72   Pulse 77   Temp 99.3 F (37.4 C) (Oral)   Resp 18   Ht 5\' 3"  (1.6 m)   Wt 97.5 kg   LMP 03/24/2018 (LMP Unknown)   SpO2 99%   BMI 38.06 kg/m  or  Vitals:   12/30/18 0441 12/30/18 0446 12/30/18 0450 12/30/18 0451  BP:      Pulse:      Resp:      Temp:      TempSrc:      SpO2: 98% 98% 99% 99%  Weight:      Height:        Dilation: Lip/rim Effacement (%): 90 Cervical Position: Anterior Station: 0 Presentation: Vertex Exam by:: Casey Burkitt, RN FHT: baseline rate 120, moderate varibility, +acel, no decel Toco: q2-4 min   Labs: Lab Results  Component Value Date   WBC 11.5 (H) 12/29/2018   HGB 10.6 (L) 12/29/2018   HCT 33.1 (L) 12/29/2018   MCV 75.1 (L) 12/29/2018   PLT 215 12/29/2018    Patient Active Problem List   Diagnosis Date Noted  . PROM (premature rupture of membranes) 12/29/2018  . 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 Supervision of normal first pregnancy, antepartum 06/03/2018    Assessment / Plan: 18 y.o. G1P0 at [redacted]w[redacted]d here for PROM.   Labor: S/p cytotec and FB. Currently on Pitocin at 16 mu/min with almost complete cervical dilation.  Fetal Wellbeing:  Cat I  Pain Control:  Epidural in place  Anticipated MOD:  NSVD   Marcy Siren, D.O. OB Fellow  12/30/2018, 4:52 AM

## 2018-12-30 NOTE — Anesthesia Preprocedure Evaluation (Signed)
Anesthesia Evaluation  Patient identified by MRN, date of birth, ID band Patient awake    Reviewed: Allergy & Precautions, Patient's Chart, lab work & pertinent test results  Airway Mallampati: II  TM Distance: >3 FB Neck ROM: Full    Dental no notable dental hx. (+) Teeth Intact   Pulmonary neg pulmonary ROS,    Pulmonary exam normal breath sounds clear to auscultation       Cardiovascular Normal cardiovascular exam Rhythm:Regular Rate:Normal     Neuro/Psych negative neurological ROS  negative psych ROS   GI/Hepatic Neg liver ROS, GERD  ,  Endo/Other  negative endocrine ROSObesity  Renal/GU negative Renal ROS  negative genitourinary   Musculoskeletal negative musculoskeletal ROS (+)   Abdominal (+) + obese,   Peds  Hematology  (+) anemia ,   Anesthesia Other Findings   Reproductive/Obstetrics (+) Pregnancy                             Anesthesia Physical Anesthesia Plan  ASA: II  Anesthesia Plan: Epidural   Post-op Pain Management:    Induction:   PONV Risk Score and Plan:   Airway Management Planned: Natural Airway  Additional Equipment:   Intra-op Plan:   Post-operative Plan:   Informed Consent: I have reviewed the patients History and Physical, chart, labs and discussed the procedure including the risks, benefits and alternatives for the proposed anesthesia with the patient or authorized representative who has indicated his/her understanding and acceptance.       Plan Discussed with: Anesthesiologist  Anesthesia Plan Comments:         Anesthesia Quick Evaluation

## 2018-12-30 NOTE — Anesthesia Procedure Notes (Signed)
Epidural Patient location during procedure: OB Start time: 12/30/2018 12:16 AM End time: 12/30/2018 12:23 AM  Staffing Anesthesiologist: Mal Amabile, MD Performed: anesthesiologist   Preanesthetic Checklist Completed: patient identified, site marked, surgical consent, pre-op evaluation, timeout performed, IV checked, risks and benefits discussed and monitors and equipment checked  Epidural Patient position: sitting Prep: site prepped and draped and DuraPrep Patient monitoring: continuous pulse ox and blood pressure Approach: midline Location: L3-L4 Injection technique: LOR air  Needle:  Needle type: Tuohy  Needle gauge: 17 G Needle length: 9 cm and 9 Needle insertion depth: 8 cm Catheter type: closed end flexible Catheter size: 19 Gauge Catheter at skin depth: 13 cm Test dose: negative and Other  Assessment Events: blood not aspirated, injection not painful, no injection resistance, negative IV test and no paresthesia  Additional Notes Patient identified. Risks and benefits discussed including failed block, incomplete  Pain control, post dural puncture headache, nerve damage, paralysis, blood pressure Changes, nausea, vomiting, reactions to medications-both toxic and allergic and post Partum back pain. All questions were answered. Patient expressed understanding and wished to proceed. Sterile technique was used throughout procedure. Epidural site was Dressed with sterile barrier dressing. No paresthesias, signs of intravascular injection Or signs of intrathecal spread were encountered.  Patient was more comfortable after the epidural was dosed. Please see RN's note for documentation of vital signs and FHR which are stable. Reason for block:procedure for pain

## 2018-12-31 NOTE — Anesthesia Postprocedure Evaluation (Signed)
Anesthesia Post Note  Patient: Psychologist, prison and probation services  Procedure(s) Performed: AN AD HOC LABOR EPIDURAL     Patient location during evaluation: Mother Baby Anesthesia Type: Epidural Level of consciousness: awake and alert and oriented Pain management: satisfactory to patient Vital Signs Assessment: post-procedure vital signs reviewed and stable Respiratory status: spontaneous breathing and nonlabored ventilation Cardiovascular status: stable Postop Assessment: no headache, no backache, no signs of nausea or vomiting, adequate PO intake, patient able to bend at knees, no apparent nausea or vomiting and able to ambulate (patient up walking) Anesthetic complications: no    Last Vitals:  Vitals:   12/31/18 0056 12/31/18 0601  BP: 113/74 (!) 98/58  Pulse: 72 70  Resp: 18 16  Temp: 36.8 C 36.5 C  SpO2: 99%     Last Pain:  Vitals:   12/31/18 1145  TempSrc:   PainSc: 3    Pain Goal: Patients Stated Pain Goal: 1 (12/31/18 1145)                 Genice Kimberlin

## 2018-12-31 NOTE — Progress Notes (Signed)
CSW received consult due to score 11 on [place] Depression Screen.  CSW met with MOB at bedside to offer support and complete assessment.  MOB breastfeeding infant with FOB present at bedside, when CSW entered the room. CSW introduced self and received verbal permission to complete assessment with FOB present. CSW explained reason for consult and MOB expressed understanding. CSW inquired about MOB's mental health history and MOB acknowledged having a history of anxiety and was diagnosed in 2016. MOB denied being on medications in the past but did acknowledge participating in therapy but stated she discontinued due to feeling better. MOB acknowledged score of 11 on Edinburgh and stated she believes she was became more worried as she got closer to her due date. MOB stated she currently feels good and happy and denied any current mental health symptoms. CSW provided education regarding Baby Blues vs PMADs and provided MOB with resources for mental health follow up.  CSW encouraged MOB to evaluate her mental health throughout the postpartum period with the use of the New Mom Checklist developed by Postpartum Progress as well as the [place] Postnatal Depression Scale and notify a medical professional if symptoms arise.  MOB denied any SI or HI and reported FOB and both families were big supports for them if she needed them.   MOB confirmed they had all essential items for infant once discharged. MOB reported infant would be sleeping in a basinet once home. CSW provided review of Sudden Infant Death Syndrome (SIDS) precautions and safe sleeping habits.    CSW identifies no further need for intervention and no barriers to discharge at this time.  Ollen Barges, [place]  [facility] [employer] [phone]

## 2018-12-31 NOTE — Lactation Note (Signed)
This note was copied from a baby's chart. Lactation Consultation Note  Patient Name: Jennifer Griffith Today's Date: 12/31/2018   Visited with [name] of term baby at 109 hrs old.  Baby at 6% weight loss.  Mom complaining of soreness, blistering of nipples.  Had used the nipple shield, but baby will not latch with it anymore.  Mom reports last latch felt better than any other latches.  Nipple noted to have slight blistering, skin intact.  Using coconut oil.  Breast shells placed for comfort.  Mom encouraged to call to desk when baby begins cueing that she is hungry for a latch assist/assess.    [name] 12/31/2018, 3:58 PM

## 2018-12-31 NOTE — Lactation Note (Signed)
This note was copied from a baby's chart. Lactation Consultation Note  Patient Name: Jennifer Griffith Today's Date: 12/31/2018 Reason for consult: Follow-up assessment;Primapara;1st time breastfeeding;Term;Nipple pain/trauma  Baby had just come off the breast and Mom holding baby hoping she will start cueing.  Baby dressed and loosely wrapped in blanket. Offered to change her diaper to awaken her, large void in diaper.    Placed baby STS in football hold on left breast.  Mom's nipples more erect now, wearing shells between feedings.  Assisted Mom in moving her hands back away from areola more to base of breast.  Baby opened very wide, and Mom brought baby to breast.  Gently tugged on baby's chin, and lip fully flanged.  Mom stated she didn't feel any pain with latch.  Taught how to use alternate breast compression during sucking bursts.  Encouraged Mom to take a deep breath and relax her body during feeding, baby very relaxed at the breast.  Baby continued breastfeeding for over 10 mins when left the room.    Mom knows to call prn for concerns.   Feeding Feeding Type: Breast Fed  LATCH Score Latch: Grasps breast easily, tongue down, lips flanged, rhythmical sucking.  Audible Swallowing: A few with stimulation  Type of Nipple: Everted at rest and after stimulation  Comfort (Breast/Nipple): Filling, red/small blisters or bruises, mild/mod discomfort  Hold (Positioning): Assistance needed to correctly position infant at breast and maintain latch.  LATCH Score: 7  Interventions Interventions: Breast feeding basics reviewed;Assisted with latch;Skin to skin;Breast massage;Breast compression;Hand express;Adjust position;Support pillows;Position options;Coconut oil;Shells  Lactation Tools Discussed/Used Tools: Shells;Coconut oil Shell Type: Inverted   Consult Status Consult Status: Follow-up Date: 01/01/19 Follow-up type: In-patient    [name] 12/31/2018, 5:23  PM

## 2018-12-31 NOTE — Progress Notes (Signed)
Post Partum Day 1 Subjective: no complaints, up ad lib, voiding and tolerating PO  Objective: Blood pressure (!) 98/58, pulse 70, temperature 97.7 F (36.5 C), temperature source Oral, resp. rate 16, height 5\' 3"  (1.6 m), weight 97.5 kg, last menstrual period 03/24/2018, SpO2 99 %, unknown if currently breastfeeding.  Physical Exam:  General: alert, cooperative and no distress Lochia: appropriate Uterine Fundus: firm Incision: healing well DVT Evaluation: No evidence of DVT seen on physical exam.  Recent Labs    12/29/18 0732  HGB 10.6*  HCT 33.1*    Assessment/Plan: Plan for discharge tomorrow and Breastfeeding   LOS: 2 days   Wynelle Bourgeois 12/31/2018, 9:05 AM

## 2019-01-01 ENCOUNTER — Encounter: Payer: Medicaid Other | Admitting: Obstetrics and Gynecology

## 2019-01-01 MED ORDER — IBUPROFEN 600 MG PO TABS: 600.0000 mg | ORAL_TABLET | Freq: Four times a day (QID) | ORAL | 0 refills | Status: DC

## 2019-01-01 NOTE — Discharge Instructions (Signed)

## 2019-01-01 NOTE — Lactation Note (Signed)
This note was copied from a baby's chart. Lactation Consultation Note  Patient Name: Jennifer Griffith Today's Date: 01/01/2019 Reason for consult: Follow-up assessment;Primapara;Term;Infant weight loss Baby is 49 hours old/10% weight loss.  Mom feels like feedings are going better.  Some nipple soreness.  She feels breasts are feeling fuller.  Observed mom easily latch baby to breast using football hold.  Baby fed well for 10 minutes with a few swallows.  Instructed on waking techniques and breast massage during feeding.  Discussed milk coming to volume and the prevention and treatment of engorgement.  Mom has a manual pump for prn use.  Questions answered.  Baby has a pediatrician appointment tomorrow.  Instructed to continue to document voids and stools.  Reviewed lactation outpatient services and encouraged to use prn.  Maternal Data Has patient been taught Hand Expression?: Yes  Feeding Feeding Type: Breast Fed  LATCH Score Latch: Grasps breast easily, tongue down, lips flanged, rhythmical sucking.  Audible Swallowing: A few with stimulation  Type of Nipple: Everted at rest and after stimulation  Comfort (Breast/Nipple): Filling, red/small blisters or bruises, mild/mod discomfort  Hold (Positioning): Assistance needed to correctly position infant at breast and maintain latch.  LATCH Score: 7  Interventions Interventions: Assisted with latch  Lactation Tools Discussed/Used     Consult Status Consult Status: Complete Follow-up type: Call as needed    Huston Foley 01/01/2019, 10:08 AM

## 2019-02-11 ENCOUNTER — Other Ambulatory Visit: Payer: Self-pay

## 2019-02-11 ENCOUNTER — Telehealth (INDEPENDENT_AMBULATORY_CARE_PROVIDER_SITE_OTHER): Payer: Medicaid Other | Admitting: Certified Nurse Midwife

## 2019-02-11 ENCOUNTER — Encounter: Payer: Self-pay | Admitting: Certified Nurse Midwife

## 2019-02-11 DIAGNOSIS — Z1389 Encounter for screening for other disorder: Secondary | ICD-10-CM

## 2019-02-11 DIAGNOSIS — O872 Hemorrhoids in the puerperium: Secondary | ICD-10-CM

## 2019-02-11 MED ORDER — TUCKS 50 % EX PADS: 1.0000 | MEDICATED_PAD | Freq: Every day | CUTANEOUS | 0 refills | Status: DC

## 2019-02-11 MED ORDER — DOCUSATE SODIUM 100 MG PO CAPS: 100.0000 mg | ORAL_CAPSULE | Freq: Two times a day (BID) | ORAL | 0 refills | Status: DC | PRN

## 2019-02-11 NOTE — Progress Notes (Signed)
TELEHEALTH POSTPARTUM VIRTUAL VIDEO VISIT ENCOUNTER NOTE   Provider [facility]: [facility] [place]   I connected with Jennifer ClampAraceli Griffith on 02/11/19 at 10:18 AM EDT by MyChart Video Encounter at home and verified that I am speaking with the correct person using two identifiers.    I discussed the limitations, risks, security and privacy concerns of performing an evaluation and management service virtually and the availability of in person appointments. I also discussed with the patient that there may be a patient responsible charge related to this service. The patient expressed understanding and agreed to proceed.  Chief Complaint: Postpartum Visit  History of Present Illness: Jennifer Griffith is a 18 y.o. G1P1001 being evaluated for postpartum followup.    She is s/p normal spontaneous vaginal delivery on 12/30/2018 at 3333w1d weeks; she was discharged to home on 5/27D#2. Pregnancy complicated by nothing. Baby is doing well. Anesthesia: epidural. Postpartum course has been unremarkable. Baby's course has been unremarkable. Baby is feeding by both breast and bottle - Enfamil AR. Bleeding staining only. Bowel function is abnormal: painful. Bladder function is normal. Patient is not sexually active. Contraception method is IUD (was put in at the hospital). Postpartum depression screening:neg  Complains of painful BM  Vaginal bleeding or discharge: Yes  Intercourse: No  Contraception: IUD Mode of feeding infant: Bottle and Breast PP depression s/s: No .  Any bowel or bladder issues: Yes   Review of Systems: Positive for painful BM. Her 12 point review of systems is negative or as noted in the History of Present Illness.  Patient Active Problem List   Diagnosis Date Noted  . PROM (premature rupture of membranes) 12/29/2018  . 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 Supervision of normal first pregnancy, antepartum 06/03/2018     Medications Jennifer Griffith had no medications administered during this visit. Current Outpatient Medications  Medication Sig Dispense Refill  . Prenatal Multivit-Min-Fe-FA (PRENATAL VITAMINS PO) Take 1 tablet by mouth daily.    Marland Kitchen. ibuprofen (ADVIL) 600 MG tablet Take 1 tablet (600 mg total) by mouth every 6 (six) hours. (Patient not taking: Reported on 02/11/2019) 30 tablet 0   No current facility-administered medications for this visit.     Allergies Patient has no known allergies.  Physical Exam:  Breastfeeding Yes  Reviewed from [facility] General:  Alert, oriented and cooperative. Patient is in no acute distress.  Mental Status: Normal mood and affect. Normal behavior. Normal judgment and thought content.   Respiratory: Normal respiratory effort noted, no problems with respiration noted  Rest of physical exam deferred due to type of encounter  PP Depression Screening:   Edinburgh Postnatal Depression Scale Screening Tool 02/11/2019 12/31/2018  I have been able to laugh and see the funny side of things. 0 0  I have looked forward with enjoyment to things. 0 0  I have blamed myself unnecessarily when things went wrong. 0 2  I have been anxious or worried for no good reason. 2 2  I have felt scared or panicky for no good reason. 2 1  Things have been getting on top of me. 0 2  I have been so unhappy that I have had difficulty sleeping. 0 2  I have felt sad or miserable. 0 1  I have been so unhappy that I have been crying. 0 1  The thought of harming myself has occurred to me. 0 0  Edinburgh Postnatal Depression Scale Total 4 11  Assessment:Patient is a 18 y.o. G1P1001 who is 6 weeks postpartum from a normal spontaneous vaginal delivery.  She is doing well.   Plan: Follow up in office for IUD string check, IUD was placed postplacental. 1. Postpartum care and examination - Normal postpartum examination  - No pap needed, <21yo   2. Hemorrhoids, postpartum condition -  Patient reports straining and painful BM since delivery  - Bleeding when wipes  - Educated and discussed hemorrhoids, encouraged dark leafy greens, increase in water consumption, use of stool softeners and tucks  - Rx for colace and tucks sent to pharmacy of choice   I discussed the assessment and treatment plan with the patient. The patient was provided an opportunity to ask questions and all were answered. The patient agreed with the plan and demonstrated an understanding of the instructions.   The patient was advised to call back or seek an in-person evaluation/go to the ED for any concerning postpartum symptoms.  I provided 11 minutes of face-to-face time during this encounter.   Lajean Manes, [place] for [employer], [place]

## 2019-02-18 ENCOUNTER — Ambulatory Visit (INDEPENDENT_AMBULATORY_CARE_PROVIDER_SITE_OTHER): Payer: Medicaid Other | Admitting: Advanced Practice Midwife

## 2019-02-18 ENCOUNTER — Other Ambulatory Visit: Payer: Self-pay

## 2019-02-18 ENCOUNTER — Encounter: Payer: Self-pay | Admitting: Advanced Practice Midwife

## 2019-02-18 DIAGNOSIS — Z30431 Encounter for routine checking of intrauterine contraceptive device: Secondary | ICD-10-CM | POA: Diagnosis not present

## 2019-02-18 DIAGNOSIS — Z975 Presence of (intrauterine) contraceptive device: Secondary | ICD-10-CM | POA: Insufficient documentation

## 2019-02-18 NOTE — Progress Notes (Signed)
  GYNECOLOGY CLINIC PROGRESS NOTE  History:  18 y.o. G1P1001 here today for today for IUD string check; Liletta IUD was placed post placental after NSVD on 12/30/18. No complaints about the Liletta, no concerning side effects.  The following portions of the patient's history were reviewed and updated as appropriate: allergies, current medications, past family history, past medical history, past social history, past surgical history and problem list. No Pap hx due to young age.  Review of Systems:  Pertinent items are noted in HPI.   Objective:  Physical Exam Blood pressure 113/70, pulse 64, height 5\' 3"  (1.6 m), weight 193 lb (87.5 kg), currently breastfeeding. Gen: NAD Abd: Soft, nontender and nondistended Pelvic: Normal appearing external genitalia; normal appearing vaginal mucosa and cervix.  IUD strings visualized, about 6-7 cm in length outside cervix. IUD strings cut to 4 cm from cervical os today.  Assessment & Plan:  Normal IUD check. Perineal repair well healed, no visible stitches  Patient to keep IUD in place for five years; can come in for removal if she desires pregnancy within the next five years. Routine preventative health maintenance measures emphasized.  Fatima Blank, CNM 10:38 AM

## 2019-04-03 ENCOUNTER — Ambulatory Visit (INDEPENDENT_AMBULATORY_CARE_PROVIDER_SITE_OTHER): Payer: Medicaid Other

## 2019-04-03 ENCOUNTER — Other Ambulatory Visit: Payer: Self-pay

## 2019-04-03 ENCOUNTER — Other Ambulatory Visit (HOSPITAL_COMMUNITY)
Admission: RE | Admit: 2019-04-03 | Discharge: 2019-04-03 | Disposition: A | Discharge status: Home / Self Care | Payer: Medicaid Other | Source: Ambulatory Visit | Attending: Obstetrics & Gynecology | Admitting: Obstetrics & Gynecology

## 2019-04-03 DIAGNOSIS — N898 Other specified noninflammatory disorders of vagina: Secondary | ICD-10-CM | POA: Diagnosis not present

## 2019-04-03 NOTE — Progress Notes (Addendum)
Pt here for self swab. Pt c/o vaginal odor and discharge for the past three days. Pt denies any UTI symptoms. Aptima sent. Pt is aware we will call with results and treat if necessary.   Attestation of Attending Supervision of RN: Evaluation and management procedures were performed by the nurse under my supervision and collaboration.  I have reviewed the nursing note and chart, and I agree with the management and plan.  Carolyn L. Harraway-Smith, M.D., [facility]

## 2019-04-07 LAB — CERVICOVAGINAL ANCILLARY ONLY
Bacterial vaginitis: NEGATIVE
Candida vaginitis: NEGATIVE

## 2019-09-29 IMAGING — US US OB COMP LESS 14 WK
1 series · 15 of 24 positions shown · non-contrast
Comparison: None.

CLINICAL DATA: 17-year-old pregnant female presents for assessment
of fetal dating, with uncertain LMP. No acute symptoms reported.

EXAM:
OBSTETRIC <14 WK ULTRASOUND
TECHNIQUE: Transabdominal ultrasound was performed for evaluation of the
gestation as well as the maternal uterus and adnexal regions.

[Series 1: us ob comp less 14 wk · 15 of 24 slices shown]
[im 1/24]
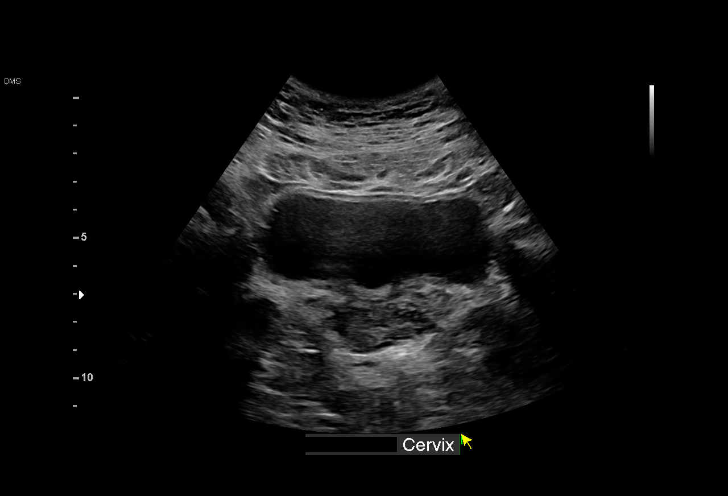
[im 3/24]
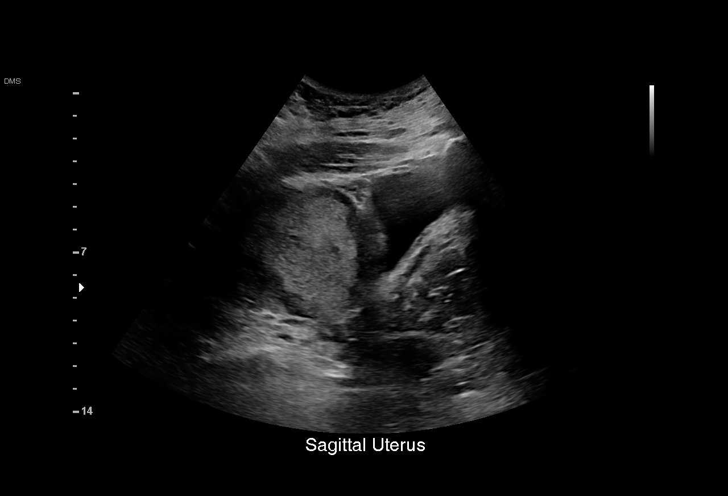
[im 5/24]
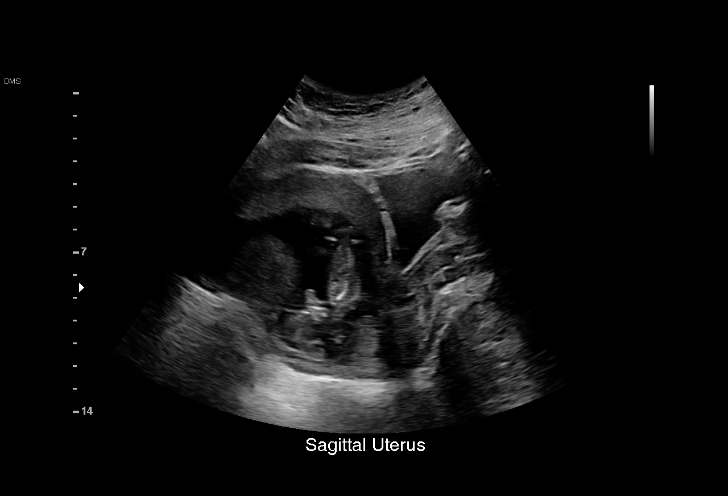
[im 6/24]
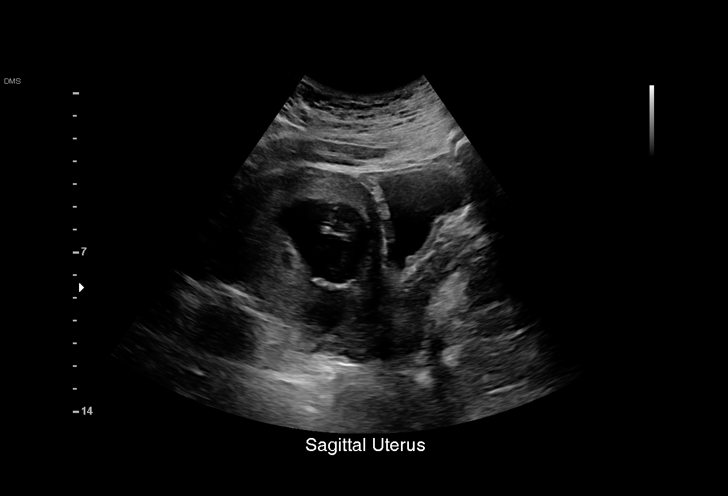
[im 8/24]
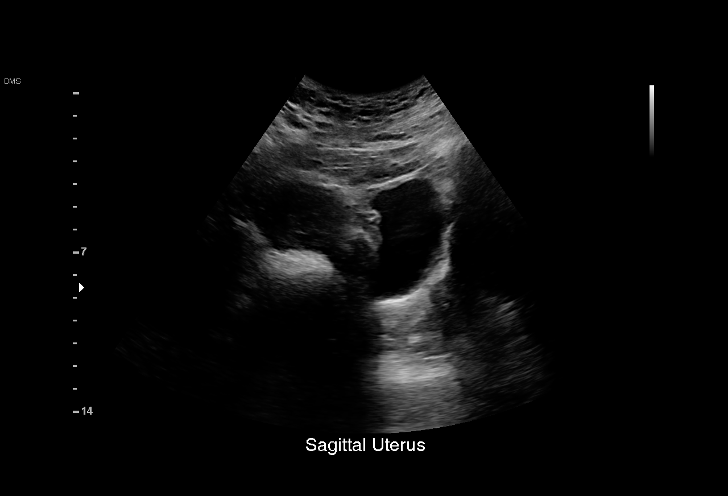
[im 9/24]
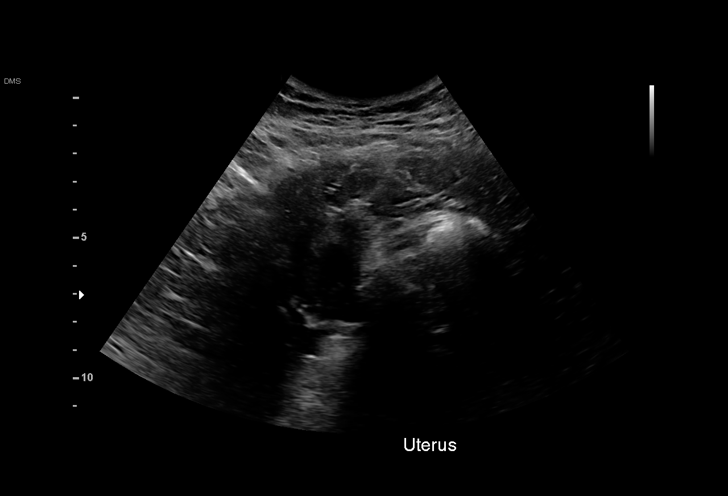
[im 11/24]
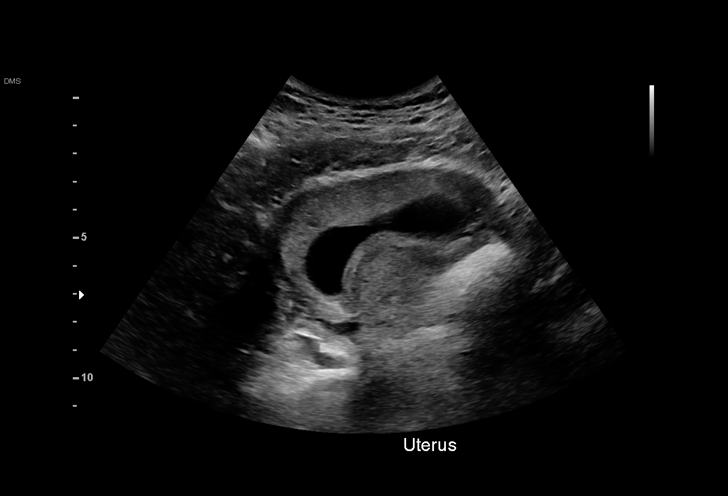
[im 13/24]
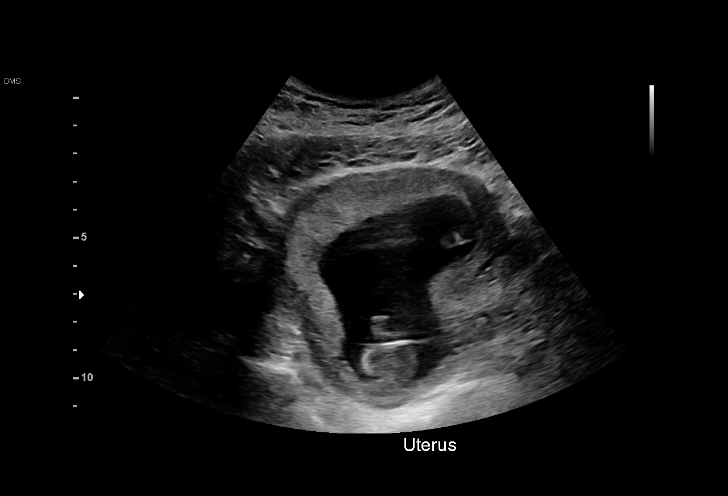
[im 14/24]
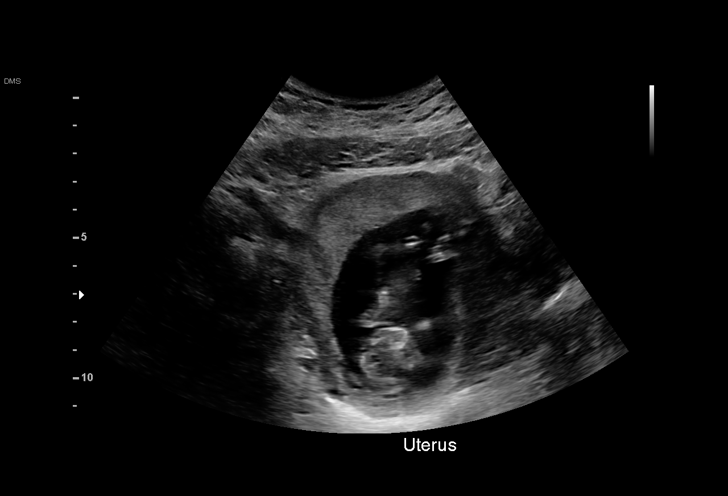
[im 16/24]
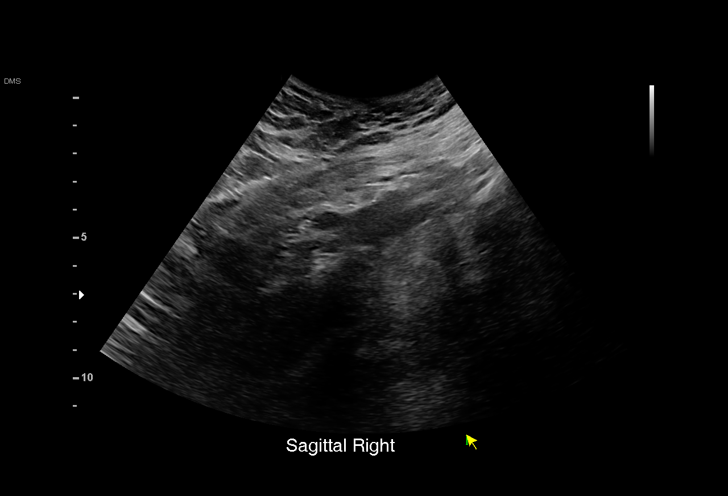
[im 17/24]
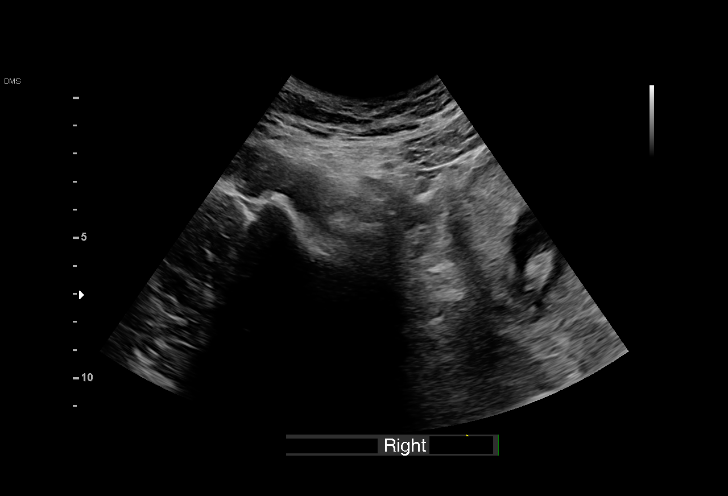
[im 19/24]
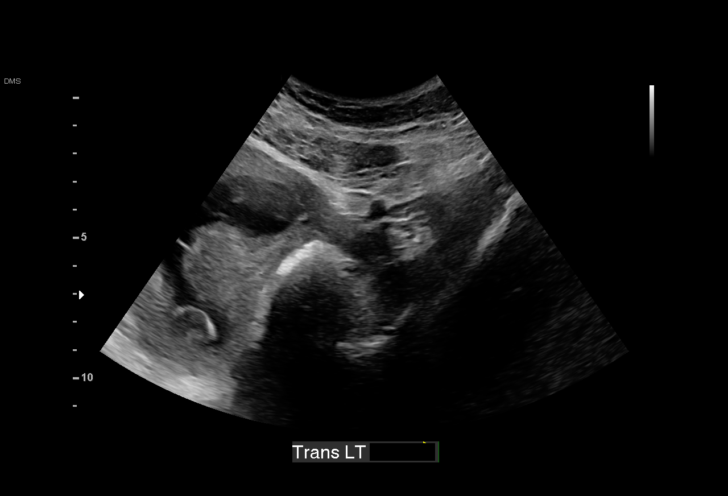
[im 21/24]
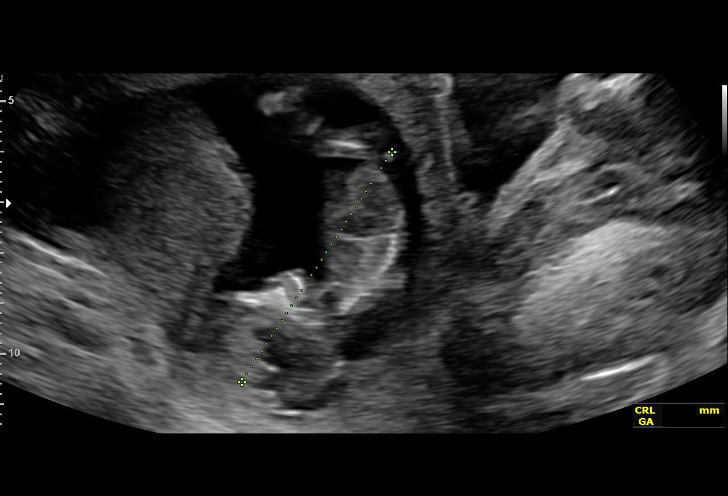
[im 22/24]
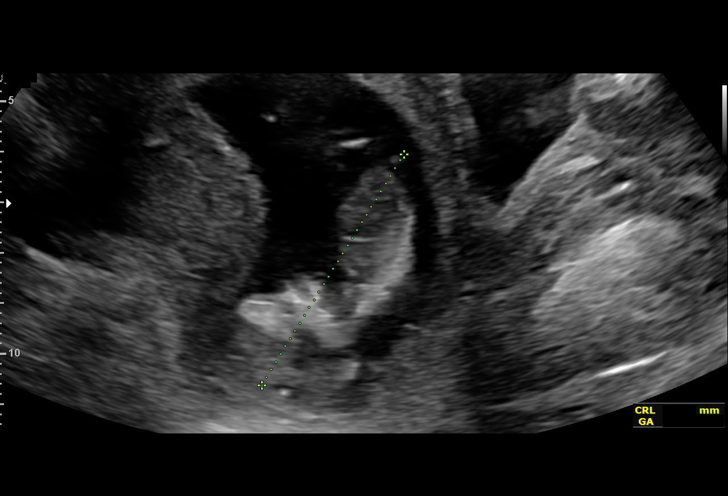
[im 24/24]
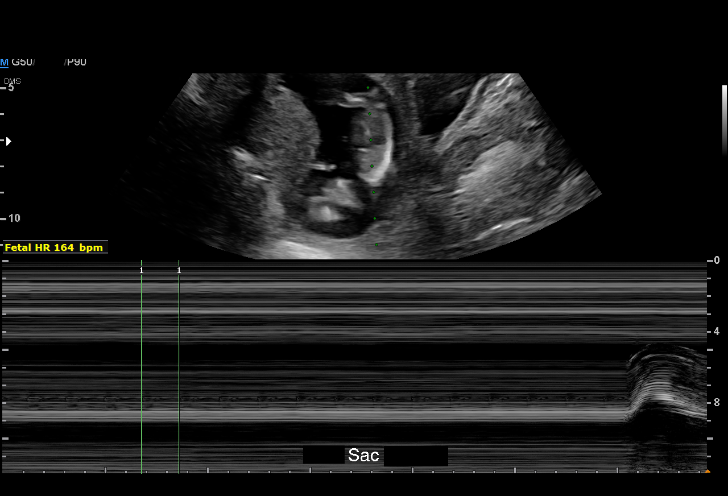

[15 of 24 positions shown; findings below may reference images not displayed]

FINDINGS: Intrauterine gestational sac: Single intrauterine gestational sac is
normal in size, shape and position.

Yolk sac:  Not Visualized.

Fetus: Visualized. Fetal anatomy not assessed at this early
gestational age.

Fetal Cardiac Activity: Regular rate and rhythm.

Fetal Heart Rate: 162 bpm

CRL:   54.2 mm   12 w 0 d                  US EDC: 12/25/2018

Subchorionic hemorrhage:  None visualized.

Maternal uterus/adnexae: The ovaries are not visualized on either
side. No adnexal masses are demonstrated. No uterine fibroids. No
abnormal free fluid in the pelvis.
IMPRESSION: Single living intrauterine gestation at 12 weeks 0 days by
crown-rump length. No acute first-trimester gestational abnormality.

## 2019-10-01 DIAGNOSIS — H0011 Chalazion right upper eyelid: Secondary | ICD-10-CM | POA: Diagnosis not present

## 2019-11-10 DIAGNOSIS — Z23 Encounter for immunization: Secondary | ICD-10-CM | POA: Diagnosis not present

## 2019-11-23 IMAGING — US US MFM OB DETAIL+14 WK
1 series · 13 of 28 positions shown · non-contrast
Comparison: none

[Series 1: us mfm ob detail+14 wk · 78 acquisitions, 13 frames shown]
[im 3/78]
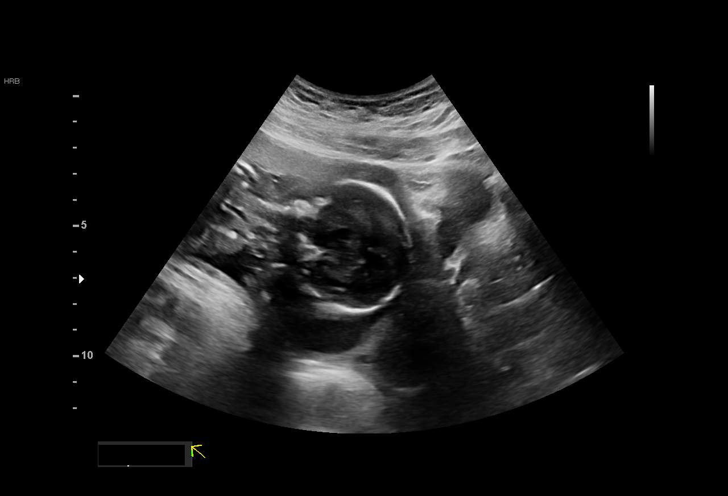
[im 9/78]
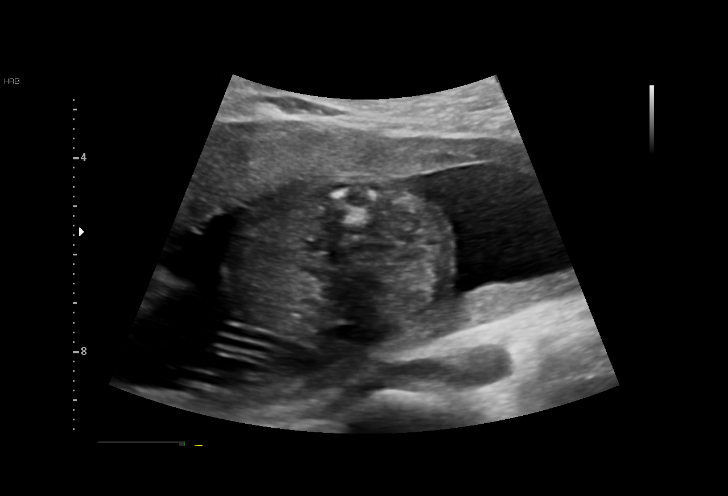
[im 15/78]
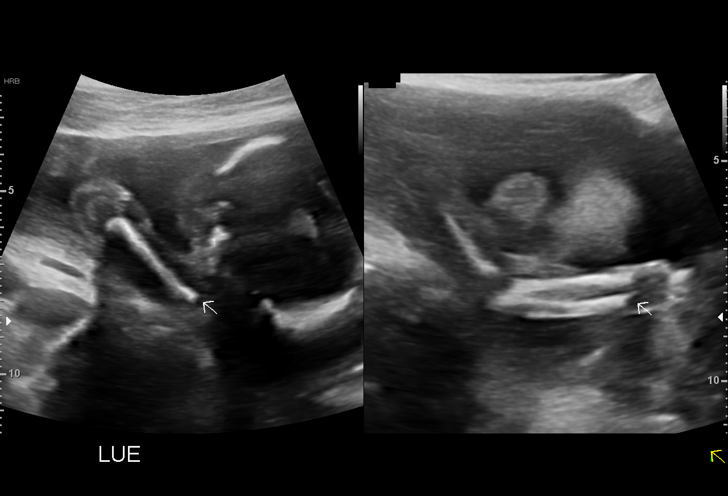
[im 20/78]
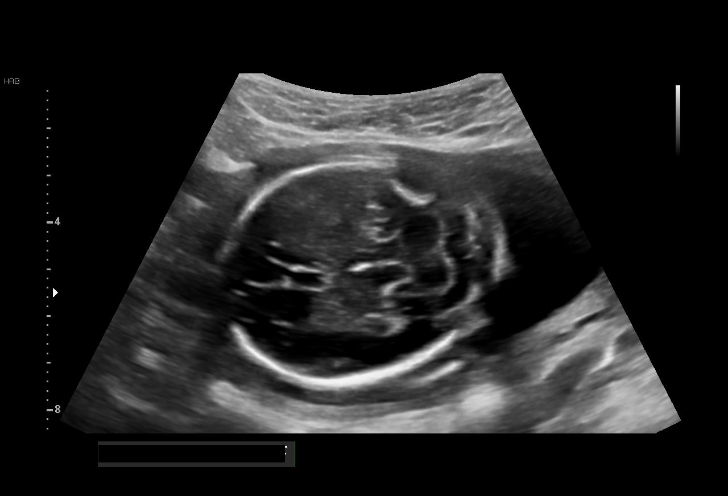
[im 26/78]
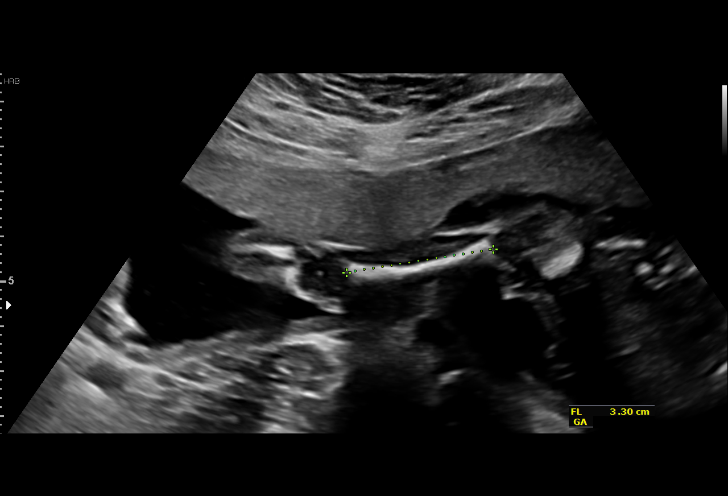
[im 32/78]
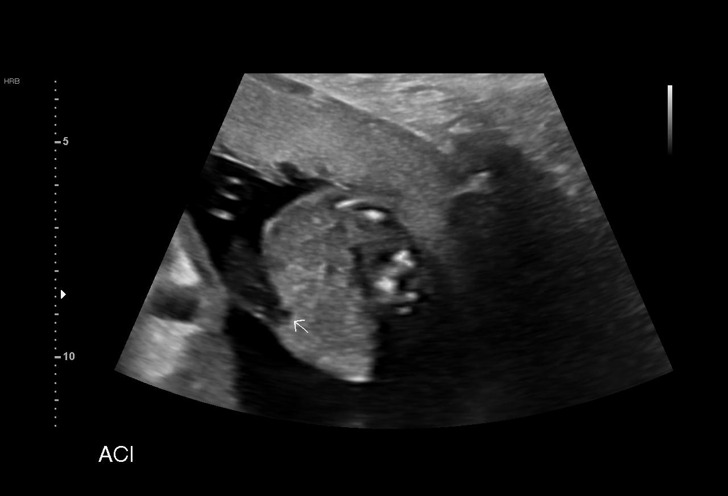
[im 40/78]
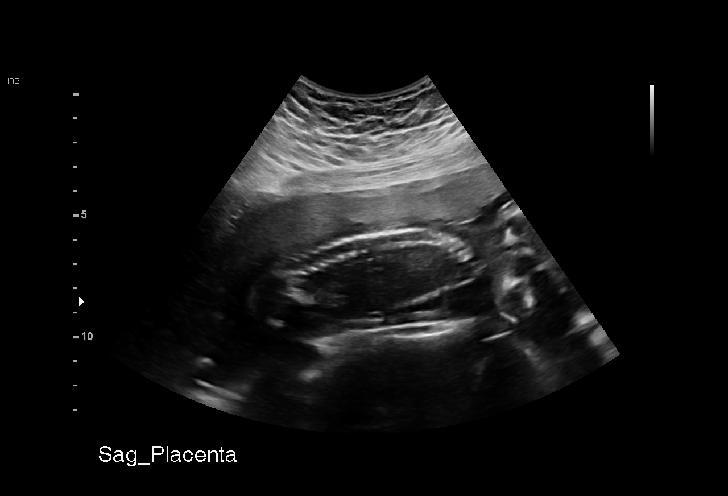
[im 46/78]
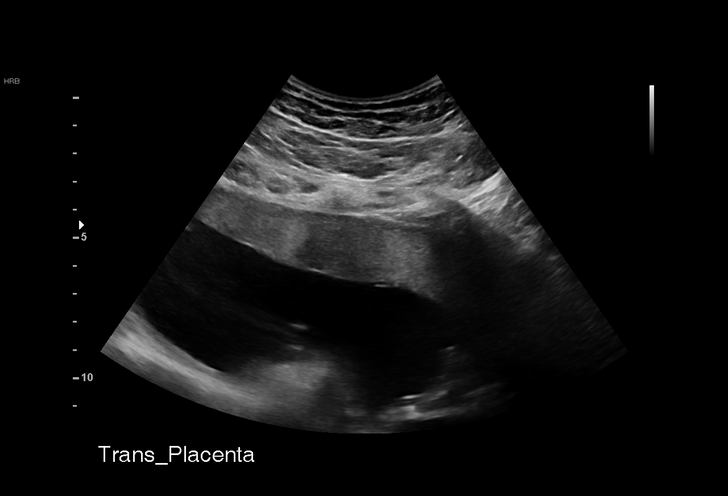
[im 52/78]
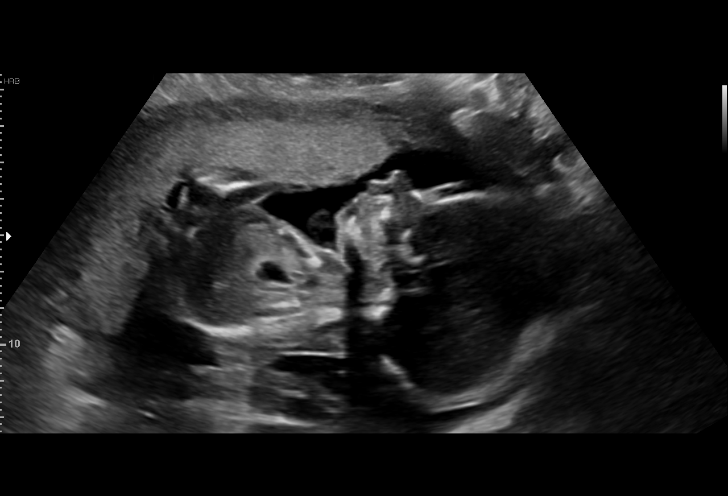
[im 58/78]
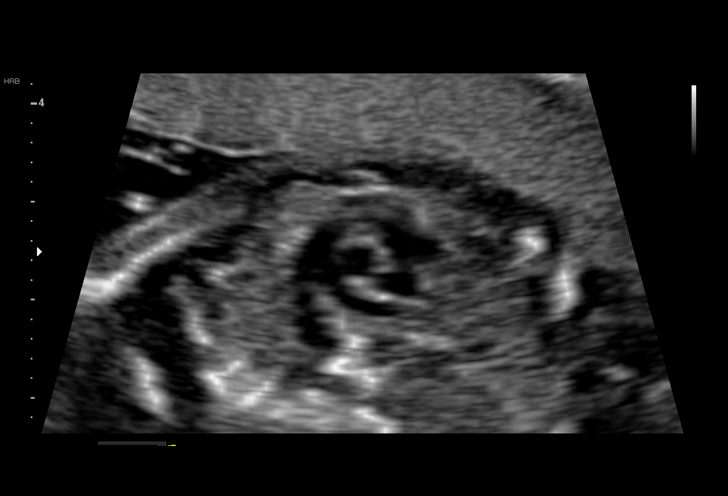
[im 63/78]
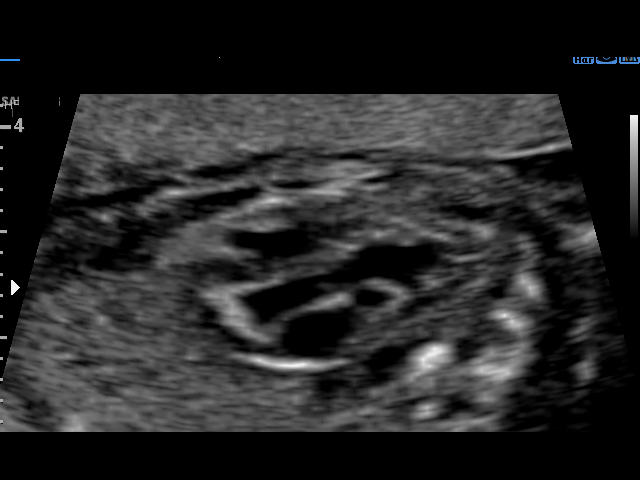
[im 69/78]
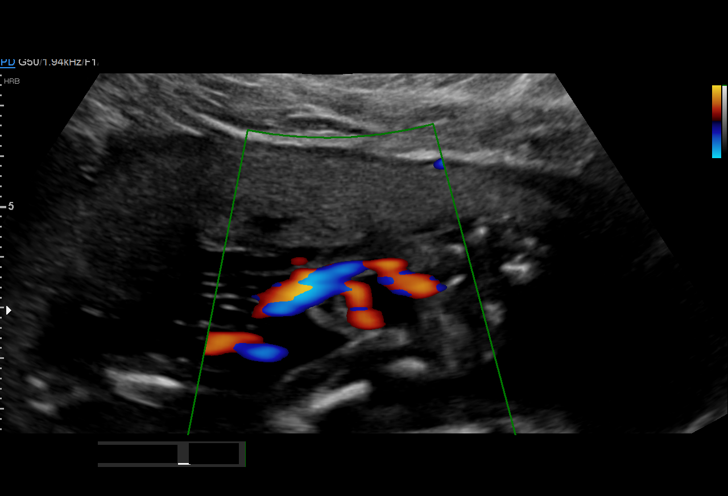
[im 75/78]
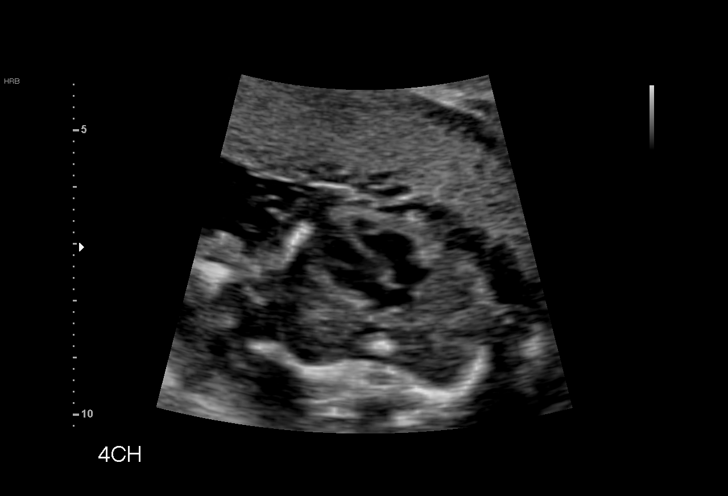

[13 of 28 positions shown; findings below may reference images not displayed]

----------------------------------------------------------------------

 ----------------------------------------------------------------------
Indications

  Obesity complicating pregnancy, second
  trimester
  Encounter for antenatal screening for
  malformations
  19 weeks gestation of pregnancy
  Encounter for uncertain dates
 ----------------------------------------------------------------------
Vital Signs

 (lb):
 BMI:
Fetal Evaluation

 Num Of Fetuses:          1
 Fetal Heart              129
 Rate(bpm):
 Cardiac Activity:        Observed
 Presentation:            Cephalic
 Placenta:                Anterior
 P. Cord Insertion:       Visualized, central

 Amniotic Fluid
 AFI FV:      Within normal limits

                             Largest Pocket(cm)

Biometry
 BPD:      46.5  mm     G. Age:  20w 0d         59  %    CI:          74.5  %    70 - 86
                                                         FL/HC:       19.1  %    16.8 -
 HC:        171  mm     G. Age:  19w 5d         35  %    HC/AC:       1.16       1.09 -
 AC:      147.4  mm     G. Age:  20w 0d         50  %    FL/BPD:      70.3  %
 FL:       32.7  mm     G. Age:  20w 1d         55  %    FL/AC:       22.2  %    20 - 24
 CER:      19.1  mm     G. Age:  18w 4d         13  %
 NFT:       4.4  mm

 LV:        5.2  mm
 CM:        3.9  mm

 Est. FW:     330   g    0 lb 12 oz      52  %
                    m
OB History

 Gravidity:    1
Gestational Age

 LMP:           19w 2d        Date:  03/24/18                 EDD:    12/29/18
 U/S Today:     20w 0d                                        EDD:    12/24/18
 Best:          19w 6d     Det. By:  Early Ultrasound         EDD:    12/25/18
                                     (06/12/18)
Anatomy

 Cranium:               Appears normal         Aortic Arch:            Appears normal
 Cavum:                 Appears normal         Ductal Arch:            Appears normal
 Ventricles:            Appears normal         Diaphragm:              Appears normal
 Choroid Plexus:        Appears normal         Stomach:                Appears normal,
                                                                       left sided
 Cerebellum:            Appears normal         Abdomen:                Appears normal
 Posterior Fossa:       Appears normal         Abdominal Wall:         Appears nml (cord
                                                                       insert, abd wall)
 Nuchal Fold:           Appears normal         Cord Vessels:           Appears normal (3
                                                                       vessel cord)
 Face:                  Appears normal         Kidneys:                Appear normal
                        (orbits and profile)
 Lips:                  Appears normal         Bladder:                Appears normal
 Thoracic:              Appears normal         Spine:                  Ltd views no
                                                                       intracranial signs of
                                                                       NT
 Heart:                 Appears normal         Upper Extremities:      Appears normal
                        (4CH, axis, and
                        situs
 RVOT:                  Appears normal         Lower Extremities:      Appears normal
 LVOT:                  Appears normal

 Other:  Parents do not wish to know sex of fetus. Technically difficult due to
         maternal habitus and fetal position.
Cervix Uterus Adnexa

 Cervix
 Length:           3.59  cm.
 Normal appearance by transabdominal scan.
 Uterus
 No abnormality visualized.

 Left Ovary
 Within normal limits.

 Right Ovary
 Within normal limits.

 Cul De Sac
 No free fluid seen.

 Adnexa
 No abnormality visualized.
Impression

 Normal interval growth.  No ultrasonic evidence of structural
 fetal anomalies.
Recommendations

 Follow up in 4 weeks to complete the fetal anatomy

## 2019-12-09 DIAGNOSIS — Z23 Encounter for immunization: Secondary | ICD-10-CM | POA: Diagnosis not present

## 2020-03-23 DIAGNOSIS — H0011 Chalazion right upper eyelid: Secondary | ICD-10-CM | POA: Diagnosis not present

## 2023-04-05 ENCOUNTER — Other Ambulatory Visit (HOSPITAL_COMMUNITY)
Admission: RE | Admit: 2023-04-05 | Discharge: 2023-04-05 | Disposition: A | Discharge status: Home / Self Care | Payer: Managed Care, Other (non HMO) | Source: Ambulatory Visit | Attending: Obstetrics and Gynecology | Admitting: Obstetrics and Gynecology

## 2023-04-05 ENCOUNTER — Ambulatory Visit (INDEPENDENT_AMBULATORY_CARE_PROVIDER_SITE_OTHER): Payer: Managed Care, Other (non HMO) | Admitting: Obstetrics and Gynecology

## 2023-04-05 ENCOUNTER — Encounter: Payer: Self-pay | Admitting: Obstetrics and Gynecology

## 2023-04-05 VITALS — BP 125/85 | HR 67 | Ht 63.0 in | Wt 235.0 lb

## 2023-04-05 DIAGNOSIS — Z30432 Encounter for removal of intrauterine contraceptive device: Secondary | ICD-10-CM

## 2023-04-05 DIAGNOSIS — Z113 Encounter for screening for infections with a predominantly sexual mode of transmission: Secondary | ICD-10-CM | POA: Insufficient documentation

## 2023-04-05 DIAGNOSIS — Z01419 Encounter for gynecological examination (general) (routine) without abnormal findings: Secondary | ICD-10-CM | POA: Diagnosis present

## 2023-04-05 NOTE — Progress Notes (Signed)
    GYNECOLOGY OFFICE PROCEDURE NOTE  Jennifer Griffith is a 22 y.o. G1P1001 here for IUD removal.   IUD Removal  Patient identified, informed consent performed, consent signed.  Patient was in the dorsal lithotomy position, normal external genitalia was noted.  A speculum was placed in the patient's vagina, normal discharge was noted, no lesions. The cervix was visualized, no lesions, no abnormal discharge.  The strings of the IUD were grasped and pulled using ring forceps. The IUD was removed in its entirety.   Patient tolerated the procedure well.    Patient plans for pregnancy soon and she was told to avoid teratogens, take PNV and folic acid.  Routine preventative health maintenance measures emphasized.  Harvie Bridge, MD Obstetrician & Gynecologist, [facility] for [employer], [facility]

## 2023-04-05 NOTE — Progress Notes (Addendum)
ANNUAL EXAM Patient name: Jennifer Griffith MRN [id number]  Date of birth: [birth year] Chief Complaint:   Annual Exam and IUD Removal  History of Present Illness:   Jennifer Griffith is a 22 y.o. G1P1001 with No LMP recorded. (Menstrual status: IUD). being seen today for a routine annual exam.  Current complaints: None  Upstream - 04/05/23 1619       Pregnancy Intention Screening   Does the patient want to become pregnant in the next year? Yes    Does the patient's partner want to become pregnant in the next year? Yes    Would the patient like to discuss contraceptive options today? No      Contraception Wrap Up   Current Method IUD or IUS    End Method No Contraception Precautions    Contraception Counseling Provided Yes    How was the end contraceptive method provided? N/A            The pregnancy intention screening data noted above was reviewed. Potential methods of contraception were discussed. The patient elected to proceed with No Contraception Precautions.   Last pap never Last mammogram: n/a. Results were: N/A. Family h/o breast cancer: no Last colonoscopy: n/a. Results were: N/A. Family h/o colorectal cancer: no HPV vaccine: unsure     09/11/2018    9:24 AM  Depression screen PHQ 2/9  Decreased Interest 1  Down, Depressed, Hopeless 0  PHQ - 2 Score 1  Altered sleeping 1  Tired, decreased energy 1  Change in appetite 0  Feeling bad or failure about yourself  1  Trouble concentrating 1  Moving slowly or fidgety/restless 0  Suicidal thoughts 0  PHQ-9 Score 5  Difficult doing work/chores Not difficult at all        09/11/2018    9:24 AM  GAD 7 : Generalized Anxiety Score  Nervous, Anxious, on Edge 2  Control/stop worrying 0  Worry too much - different things 1  Trouble relaxing 0  Restless 0  Easily annoyed or irritable 1  Afraid - awful might happen 1  Total GAD 7 Score 5  Anxiety Difficulty Not difficult at all     Review of Systems:   Pertinent  items are noted in HPI Denies any headaches, blurred vision, fatigue, shortness of breath, chest pain, abdominal pain, abnormal vaginal discharge/itching/odor/irritation, problems with periods, bowel movements, urination, or intercourse unless otherwise stated above. Pertinent History Reviewed:  Reviewed past medical,surgical, social and family history.  Reviewed problem list, medications and allergies. Physical Assessment:   Vitals:   04/05/23 1518  BP: 125/85  Pulse: 67  Weight: 235 lb (106.6 kg)  Height: 5\' 3"  (1.6 m)  Body mass index is 41.63 kg/m.        Physical Examination:   General appearance - well appearing, and in no distress  Mental status - alert, oriented to person, place, and time  Chest - respiratory effort normal  Heart - normal peripheral perfusion  Breasts - breasts appear normal, no suspicious masses, no skin or nipple changes or axillary nodes  Abdomen - soft, nontender, nondistended, no masses or organomegaly  Pelvic - VULVA: normal appearing vulva with no masses, tenderness or lesions  VAGINA: normal appearing vagina with normal color and discharge, no lesions  CERVIX: normal appearing cervix without discharge or lesions. IUD strings visible. See procedure note  Thin prep pap is done with reflex HR HPV cotesting  Chaperone present for exam  No results found for this or any previous  visit (from the past 24 hour(s)).  Assessment & Plan:  1) Well-Woman Exam Mammogram: @ 24yo, or sooner if problems Colonoscopy: @ 24yo, or sooner if problems Pap: Collected Gardasil: Unsure - pt will discuss and return for vaccine if she has not received it GC/CT: Collected HIV/HCV: Ordered  2) Encounter for IUD removal See procedure note  Labs/procedures today:   Orders Placed This Encounter  Procedures   Hepatitis C Antibody   Hepatitis B Surface AntiGEN   HIV antibody (with reflex)   RPR   Meds: No orders of the defined types were placed in this  encounter.  Follow-up: Return in about 1 year (around 04/04/2024) for annual exam or sooner as needed.  Lennart Pall, MD 04/05/2023 4:22 PM

## 2023-04-06 LAB — HEPATITIS C ANTIBODY: Hep C Virus Ab: NONREACTIVE

## 2023-04-06 LAB — HEPATITIS B SURFACE ANTIGEN: Hepatitis B Surface Ag: NEGATIVE

## 2023-04-06 LAB — RPR: RPR Ser Ql: NONREACTIVE

## 2023-04-06 LAB — HIV ANTIBODY (ROUTINE TESTING W REFLEX): HIV Screen 4th Generation wRfx: NONREACTIVE

## 2023-04-12 ENCOUNTER — Encounter: Payer: Self-pay | Admitting: Obstetrics and Gynecology

## 2023-04-12 LAB — CYTOLOGY - PAP
Chlamydia: NEGATIVE
Comment: NEGATIVE
Comment: NEGATIVE
Comment: NORMAL
Diagnosis: NEGATIVE
Neisseria Gonorrhea: NEGATIVE
Trichomonas: NEGATIVE

## 2023-06-22 ENCOUNTER — Telehealth: Payer: Self-pay | Admitting: *Deleted

## 2023-06-22 NOTE — Telephone Encounter (Signed)
Patient has an appointment scheduled with [facility] on 06/26/2023.

## 2023-06-26 DIAGNOSIS — Z1332 Encounter for screening for maternal depression: Secondary | ICD-10-CM | POA: Diagnosis not present

## 2023-06-26 DIAGNOSIS — Z3687 Encounter for antenatal screening for uncertain dates: Secondary | ICD-10-CM | POA: Diagnosis not present

## 2023-06-26 DIAGNOSIS — Z3A08 8 weeks gestation of pregnancy: Secondary | ICD-10-CM | POA: Diagnosis not present

## 2023-06-26 DIAGNOSIS — Z3481 Encounter for supervision of other normal pregnancy, first trimester: Secondary | ICD-10-CM | POA: Diagnosis not present

## 2023-07-23 DIAGNOSIS — Z118 Encounter for screening for other infectious and parasitic diseases: Secondary | ICD-10-CM | POA: Diagnosis not present

## 2023-07-23 DIAGNOSIS — Z113 Encounter for screening for infections with a predominantly sexual mode of transmission: Secondary | ICD-10-CM | POA: Diagnosis not present

## 2023-09-20 DIAGNOSIS — O99212 Obesity complicating pregnancy, second trimester: Secondary | ICD-10-CM | POA: Diagnosis not present

## 2023-09-20 DIAGNOSIS — Z363 Encounter for antenatal screening for malformations: Secondary | ICD-10-CM | POA: Diagnosis not present

## 2023-10-19 DIAGNOSIS — O358XX0 Maternal care for other (suspected) fetal abnormality and damage, not applicable or unspecified: Secondary | ICD-10-CM | POA: Diagnosis not present

## 2023-12-20 DIAGNOSIS — O99213 Obesity complicating pregnancy, third trimester: Secondary | ICD-10-CM | POA: Diagnosis not present

## 2023-12-20 DIAGNOSIS — Z3A33 33 weeks gestation of pregnancy: Secondary | ICD-10-CM | POA: Diagnosis not present

## 2024-01-07 DIAGNOSIS — Z113 Encounter for screening for infections with a predominantly sexual mode of transmission: Secondary | ICD-10-CM | POA: Diagnosis not present

## 2024-01-07 DIAGNOSIS — O99213 Obesity complicating pregnancy, third trimester: Secondary | ICD-10-CM | POA: Diagnosis not present

## 2024-01-07 DIAGNOSIS — Z3A36 36 weeks gestation of pregnancy: Secondary | ICD-10-CM | POA: Diagnosis not present

## 2024-01-07 DIAGNOSIS — Z118 Encounter for screening for other infectious and parasitic diseases: Secondary | ICD-10-CM | POA: Diagnosis not present

## 2024-01-14 DIAGNOSIS — O99213 Obesity complicating pregnancy, third trimester: Secondary | ICD-10-CM | POA: Diagnosis not present

## 2024-01-24 DIAGNOSIS — O99213 Obesity complicating pregnancy, third trimester: Secondary | ICD-10-CM | POA: Diagnosis not present

## 2024-01-24 DIAGNOSIS — Z3A38 38 weeks gestation of pregnancy: Secondary | ICD-10-CM | POA: Diagnosis not present

## 2024-01-28 DIAGNOSIS — O99213 Obesity complicating pregnancy, third trimester: Secondary | ICD-10-CM | POA: Diagnosis not present

## 2024-01-28 DIAGNOSIS — Z3A39 39 weeks gestation of pregnancy: Secondary | ICD-10-CM | POA: Diagnosis not present

## 2024-02-04 DIAGNOSIS — Z3A40 40 weeks gestation of pregnancy: Secondary | ICD-10-CM | POA: Diagnosis not present

## 2024-02-04 DIAGNOSIS — O99213 Obesity complicating pregnancy, third trimester: Secondary | ICD-10-CM | POA: Diagnosis not present

## 2024-03-27 DIAGNOSIS — Z30430 Encounter for insertion of intrauterine contraceptive device: Secondary | ICD-10-CM | POA: Diagnosis not present

## 2024-03-27 DIAGNOSIS — Z1332 Encounter for screening for maternal depression: Secondary | ICD-10-CM | POA: Diagnosis not present

## 2024-03-27 DIAGNOSIS — Z124 Encounter for screening for malignant neoplasm of cervix: Secondary | ICD-10-CM | POA: Diagnosis not present

## 2024-07-01 ENCOUNTER — Telehealth: Payer: Self-pay | Admitting: *Deleted

## 2024-07-01 NOTE — Telephone Encounter (Signed)
Left patient a message to call and schedule annual.
